# Patient Record
Sex: Female | Born: 1970 | Hispanic: Yes | Marital: Married | Smoking: Never smoker
Health system: Southern US, Community
[De-identification: ages and names within clinical notes are randomized; demographics above are authoritative.]

## PROBLEM LIST (undated history)

## (undated) DIAGNOSIS — R002 Palpitations: Secondary | ICD-10-CM

## (undated) DIAGNOSIS — C801 Malignant (primary) neoplasm, unspecified: Secondary | ICD-10-CM

## (undated) HISTORY — DX: Malignant (primary) neoplasm, unspecified: C80.1

## (undated) HISTORY — DX: Palpitations: R00.2

## (undated) HISTORY — PX: TUBAL LIGATION: SHX77

## (undated) HISTORY — PX: BASAL CELL CARCINOMA EXCISION: SHX1214

---

## 2013-11-23 ENCOUNTER — Encounter (HOSPITAL_COMMUNITY): Payer: Self-pay | Admitting: Emergency Medicine

## 2013-11-23 DIAGNOSIS — R1012 Left upper quadrant pain: Secondary | ICD-10-CM | POA: Insufficient documentation

## 2013-11-23 DIAGNOSIS — Z79899 Other long term (current) drug therapy: Secondary | ICD-10-CM | POA: Insufficient documentation

## 2013-11-23 DIAGNOSIS — Z3202 Encounter for pregnancy test, result negative: Secondary | ICD-10-CM | POA: Insufficient documentation

## 2013-11-23 LAB — CBC WITH DIFFERENTIAL/PLATELET
BASOS ABS: 0 10*3/uL (ref 0.0–0.1)
Basophils Relative: 0 % (ref 0–1)
Eosinophils Absolute: 0.2 10*3/uL (ref 0.0–0.7)
Eosinophils Relative: 2 % (ref 0–5)
HEMATOCRIT: 33.4 % — AB (ref 36.0–46.0)
HEMOGLOBIN: 11.4 g/dL — AB (ref 12.0–15.0)
LYMPHS ABS: 3.5 10*3/uL (ref 0.7–4.0)
Lymphocytes Relative: 37 % (ref 12–46)
MCH: 27.2 pg (ref 26.0–34.0)
MCHC: 34.1 g/dL (ref 30.0–36.0)
MCV: 79.7 fL (ref 78.0–100.0)
Monocytes Absolute: 0.8 10*3/uL (ref 0.1–1.0)
Monocytes Relative: 9 % (ref 3–12)
NEUTROS ABS: 5 10*3/uL (ref 1.7–7.7)
Neutrophils Relative %: 52 % (ref 43–77)
Platelets: 375 10*3/uL (ref 150–400)
RBC: 4.19 MIL/uL (ref 3.87–5.11)
RDW: 13.7 % (ref 11.5–15.5)
WBC: 9.4 10*3/uL (ref 4.0–10.5)

## 2013-11-23 NOTE — ED Notes (Signed)
Pt presents to department for evaluation of epigastric pain. Ongoing x3 days. Denies N/V/D. 5/10 pain at the time. Pt is alert and oriented x4. Currently taking Zantac at home with no relief. No signs of distress noted at present.

## 2013-11-24 ENCOUNTER — Emergency Department (HOSPITAL_COMMUNITY)
Admission: EM | Admit: 2013-11-24 | Discharge: 2013-11-24 | Disposition: A | Discharge status: Home / Self Care | Payer: Self-pay | Attending: Emergency Medicine | Admitting: Emergency Medicine

## 2013-11-24 DIAGNOSIS — R109 Unspecified abdominal pain: Secondary | ICD-10-CM

## 2013-11-24 LAB — COMPREHENSIVE METABOLIC PANEL
ALT: 14 U/L (ref 0–35)
AST: 12 U/L (ref 0–37)
Albumin: 3.6 g/dL (ref 3.5–5.2)
Alkaline Phosphatase: 72 U/L (ref 39–117)
BUN: 10 mg/dL (ref 6–23)
CO2: 22 meq/L (ref 19–32)
CREATININE: 0.51 mg/dL (ref 0.50–1.10)
Calcium: 8.8 mg/dL (ref 8.4–10.5)
Chloride: 102 mEq/L (ref 96–112)
GFR calc Af Amer: >90 mL/min (ref >90)
Glucose, Bld: 102 mg/dL — ABNORMAL HIGH (ref 70–99)
Potassium: 3.8 mEq/L (ref 3.7–5.3)
Sodium: 139 mEq/L (ref 137–147)
Total Bilirubin: <0.2 mg/dL — ABNORMAL LOW (ref 0.3–1.2)
Total Protein: 7.7 g/dL (ref 6.0–8.3)

## 2013-11-24 LAB — URINALYSIS, ROUTINE W REFLEX MICROSCOPIC
Bilirubin Urine: NEGATIVE
GLUCOSE, UA: NEGATIVE mg/dL
HGB URINE DIPSTICK: NEGATIVE
KETONES UR: NEGATIVE mg/dL
Leukocytes, UA: NEGATIVE
Nitrite: NEGATIVE
PROTEIN: NEGATIVE mg/dL
Specific Gravity, Urine: 1.02 (ref 1.005–1.030)
Urobilinogen, UA: 0.2 mg/dL (ref 0.0–1.0)
pH: 5.5 (ref 5.0–8.0)

## 2013-11-24 LAB — POC URINE PREG, ED: Preg Test, Ur: NEGATIVE

## 2013-11-24 LAB — LIPASE, BLOOD: Lipase: 27 U/L (ref 11–59)

## 2013-11-24 MED ORDER — NAPROXEN 250 MG PO TABS
500.0000 mg | ORAL_TABLET | Freq: Once | ORAL | Status: AC
  Administered 2013-11-24: 500 mg via ORAL
  Filled 2013-11-24: qty 2

## 2013-11-24 MED ORDER — CYCLOBENZAPRINE HCL 10 MG PO TABS: 10.0000 mg | ORAL_TABLET | Freq: Two times a day (BID) | ORAL | Status: DC | PRN

## 2013-11-24 MED ORDER — NAPROXEN 500 MG PO TABS: 500.0000 mg | ORAL_TABLET | Freq: Two times a day (BID) | ORAL | Status: DC

## 2013-11-24 NOTE — ED Notes (Signed)
Patient presents with c/o pain to the left rib area from lifting at work

## 2013-11-24 NOTE — Discharge Instructions (Signed)
Naprosyn twice daily.  . Emergency Department Resource Guide 1) Find a Doctor and Pay Out of Pocket Although you won't have to find out who is covered by your insurance plan, it is a good idea to ask around and get recommendations. You will then need to call the office and see if the doctor you have chosen will accept you as a new patient and what types of options they offer for patients who are self-pay. Some doctors offer discounts or will set up payment plans for their patients who do not have insurance, but you will need to ask so you aren't surprised when you get to your appointment.  2) Contact Your Local Health Department Not all health departments have doctors that can see patients for sick visits, but many do, so it is worth a call to see if yours does. If you don't know where your local health department is, you can check in your phone book. The CDC also has a tool to help you locate your state's health department, and many state websites also have listings of all of their local health departments.  3) Find a [facility] If your illness is not likely to be very severe or complicated, you may want to try a walk in clinic. These are popping up all over the country in pharmacies, drugstores, and shopping centers. They're usually staffed by nurse practitioners or physician assistants that have been trained to treat common illnesses and complaints. They're usually fairly quick and inexpensive. However, if you have serious medical issues or chronic medical problems, these are probably not your best option.  No Primary Care Doctor: - Call Health Connect at  [phone] - they can help you locate a primary care doctor that  accepts your insurance, provides certain services, etc. - Physician Referral Service- [phone]  Chronic Pain Problems: Organization         Address  Phone   Notes  [facility]  [phone] Patients need to be referred by their primary care doctor.    Medication Assistance: Organization         Address  Phone   Notes  [facility] [address] [place], [place] [address] [phone] --Must be a resident of [facility] -- Must have NO insurance coverage whatsoever (no Medicaid/ Medicare, etc.) -- The pt. MUST have a primary care doctor that directs their care regularly and follows them in the community   [facility]ist  [phone]   [employer]  [phone]    Agencies that provide inexpensive medical care: Organization         Address  Phone   Notes  [place]  [phone]   [facility]    [phone]   [facility] [place], [place] [address] [phone]   [place] [address], [place] [phone]   Planned Parenthood    [phone]   [facility]    [phone]   [place] and [address], [place] Phone:  [phone], Fax:  [phone] Hours of Operation:  9 am - 6 pm, M-F.  Also accepts Medicaid/Medicare and self-pay.  [facility] for [place] [address], [address], [place] Phone: [phone], Fax: [phone]. Hours of Operation:  8:30 am - 5:30 pm, M-F.  Also accepts Medicaid and self-pay.  [facility] [address], [place] Phone: [phone]   [place], [place], [place] [phone], Ext. [phone] Mondays & Thursdays: 7-9 AM.  First 15 patients are seen on a first come, first serve basis.    [place] Providers:  Organization         Address  Phone   Notes  [facility] [address], [place] [phone] Also accepts self-pay patients.  [facility] [address], [place]  [phone]   [place], [address], [place] [phone]   [facility] [address], [place] [phone]   Lucianne Lei [address], [place]   [phone] Only accepts [place] Access [place] patients after they have their name applied to their card.   Self-Pay (no insurance) in [facility]:  Organization         Address  Phone   Notes  Sickle Cell Patients, [facility] [place] [phone]   [facility] [place] [phone]   [facility] [place]  [place], [place], [place] [phone]   [facility]/Dr. Osei-Bonsu  [address] [place] or [address], [place] [phone] Phone number for both [place] and [place] locations is the same.  Urgent Medical and [facility] [address] [place] [phone]   [facility] [address] [place] or [address] [phone]   [facility] [address] [place] [phone], phone; [phone], fax Sees patients 1st and 3rd Saturday of every month.  Must not qualify for public or private insurance (i.e. Medicaid, Medicare, [place] Health Choice, Veterans' Benefits)  Household income should be no more than 200% of the poverty level The clinic cannot treat you if you are pregnant or think you are pregnant  Sexually transmitted diseases are not treated at the clinic.    Dental Care: Organization         Address  Phone  Notes  [facility] Department of [facility] [phone] Accepts children up to age 14 who are enrolled in [place] or [place]; pregnant women with a Medicaid card; and children who have applied for Medicaid or [place] Health Choice, but were declined, whose parents can pay a reduced fee at time of service.    [facility] Department of [facility]  [address], [place] [phone] Accepts children up to age 38 who are enrolled in [place] or [place]; pregnant women with a Medicaid card; and children who have applied for Medicaid or [place] Health Choice, but were declined, whose parents can pay a reduced fee at time of service.  [facility] Adult Dental Access PROGRAM  [place] [phone] Patients are seen by appointment only. Walk-ins are not accepted. [place] will see patients 47 years of age and older. Monday - Tuesday (8am-5pm) Most Wednesdays (8:30-5pm) $30 per visit, cash only  [facility] Adult Dental Access PROGRAM  [address], [facility] [phone] Patients are seen by appointment only. Walk-ins are not accepted. [place] will see patients 18 years  of age and older. One Wednesday Evening (Monthly: Volunteer Based).  $30 per visit, cash only  [place]  [phone] for adults; Children under age 87, call Graduate Pediatric Dentistry at [phone]. Children aged 20-14, please call [phone] to request a pediatric application.  Dental services are provided in all areas of dental care including fillings, crowns and bridges, complete and partial dentures, implants, gum treatment, root canals, and extractions. Preventive care is also provided. Treatment is provided to both adults and children. Patients are selected via a lottery and there is often a waiting list.   [facility] [address], [place]  [phone] [email or web address]   [facility] [address] [place], [place] [phone], Ext. [phone] Second and Fourth Thursday of each month, opens at 6:30 AM; Clinic ends at 9 AM.  Patients are seen on a first-come first-served basis, and a limited number are seen during each clinic.   [facility]  [address] [place], [place] [phone]   Eligibility Requirements You must have lived in [place], [place], or [place] counties for at least the last three months.   You cannot be eligible for state or federal sponsored [employer], including [employer], [place], or [employer].   You generally cannot be eligible for healthcare insurance through your employer.    How to apply: Eligibility screenings are held every Tuesday and Wednesday afternoon from 1:00 pm until 4:00 pm. You do not need an appointment for the interview!  [facility] [address] [place], [place]   [place]  Wells Department  [place]  [phone]    Behavioral Health Resources in the Community: Intensive Outpatient Programs Organization         Address  Phone  Notes  [facility] [place]. [address] [place], [place] [phone]   [facility] [address] [place], [place]   ADS: Alcohol & Drug S[facility] [address], [place], [place]   [place]up[place] [address]  [place], [place] or [phone]   Substance Abuse Resources Organization         Address  Phone  Notes  Alcohol and Drug Services  [phone]   [place]  [phone]   The [place]   Chinita Pester  [phone]   Residential & Outpatient Substance Abuse Program  [phone]   Psychological Services Organization         Address  Phone  Notes  [facility]  [place]  [phone]   [place] [address], [place] or [phone]    Mobile Crisis Teams Organization         Address  Phone  Notes  [facility], Mobile Crisis Care Unit  [phone]   [facility]  [address] [place], [place]   [place] [address], [place] [phone]    Self-Help/Support Groups Organization         Address  Phone             Notes  [place]. of [place] - variety of support groups  [place] Call for more information  Narcotics Anonymous (NA), [facility] [address] [place]  2 meetings at this location   Residential Treatment Programs Organization         Address  Phone  Notes  [facility] [address]    [place]  [phone]   [facility]  [address] [place] [phone], [place], [place]   [place], [place] [phone] Admissions: 8am-3pm M-F  Incentives Substance [place] [address]    [place], [place] [phone]   [facility] [address] [place], [place], [place]   [facility] [address]  [place], [place]   [facility] - Intensive Outpatient [address], [place], [place]   [facility] ([place].,  [place], [place] [phone] or [phone]   [facility] Treatment Services (RTS) [address] [place], [place] Accepts Medicaid  [facility] [place] [address]  [place] [phone] Substance Abuse/Addiction Treatment   [facility] Organization         Address  Phone  Notes  [facility]  [phone]   Domenic Schwab, PhD [address] [place], [place]   [phone] or [phone]   [place] [facility] [place], [place] [phone]   [facility] [address], [place], [place] [phone] Insurance/Medicaid/sponsorship through [facility] and Families [address]                                    [place], [place] [phone]  [place] [address], [place] [phone]    Dr. Adele Schilder  [phone]   [facility] Dept. 1) [address], [place] 2) [place] 3)  [place] [address], [place] [phone]  [phone]   [place] [phone] or [phone] (After Hours)

## 2013-11-24 NOTE — ED Provider Notes (Signed)
CSN: [id number]     Arrival date & time 11/23/13  2252 History   First MD Initiated Contact with Patient 11/24/13 0202     Chief Complaint  Patient presents with  . Abdominal Pain     (Consider location/radiation/quality/duration/timing/severity/associated sxs/prior Treatment) HPI Comments: 43 year old female who presents with left upper quadrant abdominal pain which has been present for 3 days, constant, worse with trying to bend over and pick up heavy things, not associated with nausea vomiting diarrhea fevers chills dysuria diarrhea constipation or blood in the stools. She has no chest pain, no cough, no shortness of breath no fever. The symptoms are mild at rest. She does not relate the onset of these symptoms with any traumatic injury. She has no history of prior abdominal surgery.  Patient is a 43 y.o. female presenting with abdominal pain. The history is provided by the patient and a relative.  Abdominal Pain   History reviewed. No pertinent past medical history. History reviewed. No pertinent past surgical history. No family history on file. History  Substance Use Topics  . Smoking status: Never Smoker   . Smokeless tobacco: Not on file  . Alcohol Use: No   OB History   Grav Para Term Preterm Abortions TAB SAB Ect Mult Living                 Review of Systems  Gastrointestinal: Positive for abdominal pain.  All other systems reviewed and are negative.      Allergies  Review of patient's allergies indicates no known allergies.  Home Medications   Current Outpatient Rx  Name  Route  Sig  Dispense  Refill  . fenofibrate 54 MG tablet   Oral   Take 54 mg by mouth daily.         . ranitidine (ZANTAC) 150 MG tablet   Oral   Take 150 mg by mouth 2 (two) times daily.         . cyclobenzaprine (FLEXERIL) 10 MG tablet   Oral   Take 1 tablet (10 mg total) by mouth 2 (two) times daily as needed for muscle spasms.   20 tablet   0   . naproxen (NAPROSYN) 500 MG  tablet   Oral   Take 1 tablet (500 mg total) by mouth 2 (two) times daily with a meal.   30 tablet   0    BP 144/74  Pulse 84  SpO2 100% Physical Exam  Nursing note and vitals reviewed. Constitutional: She appears well-developed and well-nourished. No distress.  HENT:  Head: Normocephalic and atraumatic.  Mouth/Throat: Oropharynx is clear and moist. No oropharyngeal exudate.  Eyes: Conjunctivae and EOM are normal. Pupils are equal, round, and reactive to light. Right eye exhibits no discharge. Left eye exhibits no discharge. No scleral icterus.  Neck: Normal range of motion. Neck supple. No JVD present. No thyromegaly present.  Cardiovascular: Normal rate, regular rhythm, normal heart sounds and intact distal pulses.  Exam reveals no gallop and no friction rub.   No murmur heard. Pulmonary/Chest: Effort normal and breath sounds normal. No respiratory distress. She has no wheezes. She has no rales.  Abdominal: Soft. Bowel sounds are normal. She exhibits no distension and no mass. There is tenderness ( Focal left upper quadrant tenderness which is mild, reproducible. Positive Carnett's sign).  Musculoskeletal: Normal range of motion. She exhibits no edema and no tenderness.  Lymphadenopathy:    She has no cervical adenopathy.  Neurological: She is alert. Coordination normal.  Skin: Skin is warm and dry. No rash noted. No erythema.  Psychiatric: She has a normal mood and affect. Her behavior is normal.    ED Course  Procedures (including critical care time) Labs Review Labs Reviewed  CBC WITH DIFFERENTIAL - Abnormal; Notable for the following:    Hemoglobin 11.4 (*)    HCT 33.4 (*)    All other components within normal limits  COMPREHENSIVE METABOLIC PANEL - Abnormal; Notable for the following:    Glucose, Bld 102 (*)    Total Bilirubin <0.2 (*)    All other components within normal limits  LIPASE, BLOOD  URINALYSIS, ROUTINE W REFLEX MICROSCOPIC  POC URINE PREG, ED    Imaging Review No results found.   EKG Interpretation None      MDM   Final diagnoses:  Abdominal pain    No CVA tenderness, labs are unremarkable, urinalysis and pregnancy pending though I suspect a muscle wall strain. No other specific symptoms or abdominal symptoms that would be suggestive of a intra-abdominal or surgical process. Pain medicine is ordered.  No imaging necessary, patient has normal urinalysis, not pregnant, [place] as below   Meds given in ED:  Medications  naproxen (NAPROSYN) tablet 500 mg (500 mg Oral Given 11/24/13 0306)    New Prescriptions   CYCLOBENZAPRINE (FLEXERIL) 10 MG TABLET    Take 1 tablet (10 mg total) by mouth 2 (two) times daily as needed for muscle spasms.   NAPROXEN (NAPROSYN) 500 MG TABLET    Take 1 tablet (500 mg total) by mouth 2 (two) times daily with a meal.      Johnna Acosta, MD 11/24/13 [phone]

## 2015-02-13 ENCOUNTER — Encounter (HOSPITAL_COMMUNITY): Payer: Self-pay | Admitting: *Deleted

## 2015-02-13 ENCOUNTER — Emergency Department (HOSPITAL_COMMUNITY): Payer: Self-pay

## 2015-02-13 ENCOUNTER — Emergency Department (HOSPITAL_COMMUNITY)
Admission: EM | Admit: 2015-02-13 | Discharge: 2015-02-13 | Disposition: A | Discharge status: Home / Self Care | Payer: Self-pay | Attending: Emergency Medicine | Admitting: Emergency Medicine

## 2015-02-13 DIAGNOSIS — Z791 Long term (current) use of non-steroidal anti-inflammatories (NSAID): Secondary | ICD-10-CM | POA: Insufficient documentation

## 2015-02-13 DIAGNOSIS — R51 Headache: Secondary | ICD-10-CM | POA: Insufficient documentation

## 2015-02-13 DIAGNOSIS — R110 Nausea: Secondary | ICD-10-CM | POA: Insufficient documentation

## 2015-02-13 DIAGNOSIS — R002 Palpitations: Secondary | ICD-10-CM | POA: Insufficient documentation

## 2015-02-13 DIAGNOSIS — R457 State of emotional shock and stress, unspecified: Secondary | ICD-10-CM | POA: Insufficient documentation

## 2015-02-13 DIAGNOSIS — R531 Weakness: Secondary | ICD-10-CM | POA: Insufficient documentation

## 2015-02-13 DIAGNOSIS — R42 Dizziness and giddiness: Secondary | ICD-10-CM | POA: Insufficient documentation

## 2015-02-13 LAB — COMPREHENSIVE METABOLIC PANEL
ALBUMIN: 3.9 g/dL (ref 3.5–5.0)
ALK PHOS: 78 U/L (ref 38–126)
ALT: 26 U/L (ref 14–54)
ANION GAP: 7 (ref 5–15)
AST: 20 U/L (ref 15–41)
BILIRUBIN TOTAL: 0.3 mg/dL (ref 0.3–1.2)
BUN: 12 mg/dL (ref 6–20)
CALCIUM: 9 mg/dL (ref 8.9–10.3)
CO2: 28 mmol/L (ref 22–32)
Chloride: 101 mmol/L (ref 101–111)
Creatinine, Ser: 0.55 mg/dL (ref 0.44–1.00)
GFR calc Af Amer: >60 mL/min (ref >60)
Glucose, Bld: 155 mg/dL — ABNORMAL HIGH (ref 65–99)
Potassium: 3.7 mmol/L (ref 3.5–5.1)
SODIUM: 136 mmol/L (ref 135–145)
Total Protein: 7.5 g/dL (ref 6.5–8.1)

## 2015-02-13 LAB — CBC WITH DIFFERENTIAL/PLATELET
Basophils Absolute: 0 10*3/uL (ref 0.0–0.1)
Basophils Relative: 0 % (ref 0–1)
EOS ABS: 0.1 10*3/uL (ref 0.0–0.7)
Eosinophils Relative: 1 % (ref 0–5)
HCT: 37.6 % (ref 36.0–46.0)
HEMOGLOBIN: 12.2 g/dL (ref 12.0–15.0)
LYMPHS ABS: 2.5 10*3/uL (ref 0.7–4.0)
Lymphocytes Relative: 25 % (ref 12–46)
MCH: 26.5 pg (ref 26.0–34.0)
MCHC: 32.4 g/dL (ref 30.0–36.0)
MCV: 81.7 fL (ref 78.0–100.0)
MONOS PCT: 5 % (ref 3–12)
Monocytes Absolute: 0.4 10*3/uL (ref 0.1–1.0)
Neutro Abs: 6.8 10*3/uL (ref 1.7–7.7)
Neutrophils Relative %: 69 % (ref 43–77)
Platelets: 462 10*3/uL — ABNORMAL HIGH (ref 150–400)
RBC: 4.6 MIL/uL (ref 3.87–5.11)
RDW: 13.4 % (ref 11.5–15.5)
WBC: 9.8 10*3/uL (ref 4.0–10.5)

## 2015-02-13 MED ORDER — LORAZEPAM 0.5 MG PO TABS
0.5000 mg | ORAL_TABLET | Freq: Once | ORAL | Status: AC
  Administered 2015-02-13: 0.5 mg via ORAL
  Filled 2015-02-13: qty 1

## 2015-02-13 MED ORDER — SODIUM CHLORIDE 0.9 % IV BOLUS (SEPSIS)
1000.0000 mL | Freq: Once | INTRAVENOUS | Status: AC
  Administered 2015-02-13: 1000 mL via INTRAVENOUS

## 2015-02-13 NOTE — ED Notes (Signed)
Pt walked to and from restroom 3 O2 was 97 pt state did not feel weakness

## 2015-02-13 NOTE — ED Provider Notes (Signed)
CSN: [id number]     Arrival date & time 02/13/15  1533 History   First MD Initiated Contact with Patient 02/13/15 1814     Chief Complaint  Patient presents with  . Nausea  . Headache  . Weakness     (Consider location/radiation/quality/duration/timing/severity/associated sxs/prior Treatment) HPI   PCP: No PCP Per Patient Blood pressure 144/79, pulse 78, temperature 97.9 F (36.6 C), temperature source Oral, resp. rate 21, last menstrual period 02/01/2015, SpO2 98 %.  Miranda Navarro is a 45 y.o.female without any significant PMH presents to the ER with complaints of altered symptoms. She reports that at bedtime she notices heart palpitations and has the urge to cry. She lately has been feeling weak with some intermittent dizziness. She is emotional and tearful during the interview when she talks about tomorrow being the anniversary of her grandmother dying. She is very upset about it. When she lays down at bed she thinks about her family and it upsets her and causes a headache. She is also taking weight loss pills that she recently started and has not been eating or drinking as much lately. She is brought here by her family for evaluation.  The patient denies diaphoresis, fever, headache, weakness (general or focal), confusion, change of vision,  neck pain, dysphagia, aphagia, chest pain, shortness of breath,  back pain, abdominal pains, nausea, vomiting, diarrhea, lower extremity swelling, rash.    History reviewed. No pertinent past medical history. History reviewed. No pertinent past surgical history. No family history on file. History  Substance Use Topics  . Smoking status: Never Smoker   . Smokeless tobacco: Not on file  . Alcohol Use: No   OB History    No data available     Review of Systems  10 Systems reviewed and are negative for acute change except as noted in the HPI.    Allergies  Review of patient's allergies indicates no known allergies.  Home  Medications   Prior to Admission medications   Medication Sig Start Date End Date Taking? Authorizing Provider  cyclobenzaprine (FLEXERIL) 10 MG tablet Take 1 tablet (10 mg total) by mouth 2 (two) times daily as needed for muscle spasms. Patient not taking: Reported on 02/13/2015 11/24/13   Noemi Chapel, MD  naproxen (NAPROSYN) 500 MG tablet Take 1 tablet (500 mg total) by mouth 2 (two) times daily with a meal. Patient not taking: Reported on 02/13/2015 11/24/13   Noemi Chapel, MD   BP 120/65 mmHg  Pulse 68  Temp(Src) 97.5 F (36.4 C) (Oral)  Resp 22  SpO2 99%  LMP 02/01/2015 Physical Exam  Constitutional: She appears well-developed and well-nourished. No distress.  HENT:  Head: Normocephalic and atraumatic.  Eyes: Pupils are equal, round, and reactive to light.  Neck: Normal range of motion. Neck supple.  Cardiovascular: Normal rate and regular rhythm.   Pulmonary/Chest: Effort normal and breath sounds normal. She has no decreased breath sounds. She has no wheezes.  Abdominal: Soft.  Neurological: She is alert.  Cranial nerves II-VIII and X-XII evaluated and show no deficits. Pt alert and oriented x 3 Upper and lower extremity strength is symmetrical and physiologic Normal muscular tone No facial droop Coordination intact, no limb ataxia,  No pronator drift   Skin: Skin is warm and dry.  Nursing note and vitals reviewed.   ED Course  Procedures (including critical care time) Labs Review Labs Reviewed  CBC WITH DIFFERENTIAL/PLATELET - Abnormal; Notable for the following:    Platelets 462 (*)  All other components within normal limits  COMPREHENSIVE METABOLIC PANEL - Abnormal; Notable for the following:    Glucose, Bld 155 (*)    All other components within normal limits    Imaging Review Dg Chest 2 View  02/13/2015   CLINICAL DATA:  Patient with nausea, dizziness and chest palpitations for 15 days.  EXAM: CHEST  2 VIEW  COMPARISON:  None.  FINDINGS: Normal cardiac and  mediastinal contours. No consolidative pulmonary opacities. No pleural effusion or pneumothorax. Regional skeleton is unremarkable. Projecting over the mediastinum on frontal view is a nonspecific radiodensity, potentially external to the patient.  IMPRESSION: No acute cardiopulmonary process.  Radiodensity projecting over the mediastinum on the frontal view, potentially external to the patient.   Electronically Signed   By: Lovey Newcomer M.D.   On: 02/13/2015 19:22     EKG Interpretation   Date/Time:  Monday February 13 2015 19:34:07 EDT Ventricular Rate:  78 PR Interval:  180 QRS Duration: 86 QT Interval:  391 QTC Calculation: 445 R Axis:   37 Text Interpretation:  Sinus rhythm Borderline T abnormalities, diffuse  leads No previous tracing Confirmed by POLLINA  MD, CHRISTOPHER [phone]) on  02/13/2015 8:06:54 PM      MDM   Final diagnoses:  Palpitation    Patient has had no chest pain or shortness of breath. She has been taking Diet pills, name of them is not known and her symptoms have worsened since then. She also is emotionally upset about the anniversary of her grandmothers death approaching. She has family present who is sympathetic and comforts patient. The patient cries every time I ask about her stress. I will give resources for support groups. She also has been advised to discontinue taking her diet pills, they can be dangerous and may be causing her symptoms as well. She needs to eat a well balanced diet in order to improve her headaches and weakness as well. Pt feeling very well at baseline with no weakness, headache, palpitations or any other associated symptoms.  CBC and CMP are unremarkable, non acute EKG and chest xray reassuring. Referral to Cardiology for halter monitor if she continues to have palpitations.  Medications  sodium chloride 0.9 % bolus 1,000 mL (0 mLs Intravenous Stopped 02/13/15 2040)  LORazepam (ATIVAN) tablet 0.5 mg (0.5 mg Oral Given 02/13/15 2001)    44  y.o.Miranda Navarro's evaluation in the Emergency Department is complete. It has been determined that no acute conditions requiring further emergency intervention are present at this time. The patient/guardian have been advised of the diagnosis and plan. We have discussed signs and symptoms that warrant return to the ED, such as changes or worsening in symptoms.  Vital signs are stable at discharge. Filed Vitals:   02/13/15 2051  BP: 120/65  Pulse: 68  Temp: 97.5 F (36.4 C)  Resp: 22    Patient/guardian has voiced understanding and agreed to follow-up with the PCP or specialist.    Delos Haring, PA-C 02/13/15 2100  Daleen Bo, MD 02/13/15 2322

## 2015-02-13 NOTE — ED Notes (Signed)
Patient transported to CT 

## 2015-02-13 NOTE — ED Notes (Signed)
Patient transported to X-ray 

## 2015-02-13 NOTE — ED Notes (Signed)
Pt complains of nausea, dizziness, chest palpitations for the past 15 days. Pt also complains of headache for the past 4 days. Pt denies vomiting/diarrhea/abdominal pain. Pt speaks spanish.

## 2015-02-13 NOTE — Discharge Instructions (Signed)
° °Estrés y control del estrés °(Stress and Stress Management) °El estrés es una reacción normal a los sucesos de la vida. Es lo que se siente cuando la vida le exige más de lo que está acostumbrado o más de lo que puede manejar. Un poco de estrés puede ser útil. Por ejemplo, la reacción al estrés puede ayudarlo a tomar el último autobús del día, a estudiar para un examen o a cumplir con un plazo límite en el trabajo. Sin embargo, el estrés muy frecuente o que dura mucho tiempo puede causar problemas. Puede afectar la salud emocional y dificultar las relaciones y las actividades cotidianas normales. El exceso de estrés puede debilitar el sistema inmunitario y aumentar el riesgo de que tenga enfermedades físicas. Si ya tiene un problema médico, el estrés puede empeorarlo. °CAUSAS  °Todos los sucesos de la vida pueden causar estrés. Un suceso que le causa estrés a una persona puede no ser estresante para otra. Generalmente, los sucesos importantes de la vida causan estrés. Estos pueden ser positivos o negativos, por ejemplo, quedarse sin empleo, mudarse a una casa nueva, casarse, tener un bebé o perder a un ser querido. Los sucesos de la vida menos evidentes también pueden causar estrés, especialmente si ocurren día tras día o en combinación. Por ejemplo, trabajar muchas horas, conducir cuando hay mucho tránsito, cuidar a los niños, tener deudas o tener una relación difícil. °SIGNOS Y SÍNTOMAS °El estrés puede causar síntomas emocionales que incluyen lo siguiente: °· Ansiedad. Sentirse preocupado, temeroso, nervioso, abrumado o fuera de control. °· Enojo. Sentirse irritado o impaciente. °· Depresión. Sentirse triste, decaído, desesperanzado o culpable. °· Dificultad para concentrarse, recordar o tomar decisiones. °El estrés puede causar síntomas físicos que incluyen lo siguiente:  °· Dolores y molestias. Estos pueden afectar la cabeza, el cuello, la espalda, el estómago u otras zonas del cuerpo. °· Rigidez muscular o  tensión mandibular. °· Poca energía o dificultad para dormir. °El estrés puede provocar conductas poco saludables, que incluyen lo siguiente:  °· Comer para sentirse mejor (comer en exceso) o saltear comidas. °· Dormir muy poco, mucho o ambas cosas. °· Trabajar mucho o postergar tareas (posponer). °· Fumar, beber alcohol o consumir drogas para sentirse mejor. °DIAGNÓSTICO  °El estrés se diagnostica a través de una evaluación que realiza el médico. El médico le hará preguntas sobre los síntomas o cualquier suceso estresante de la vida. Además, el médico le hará preguntas sobre sus antecedentes médicos y tal vez indique análisis de sangre u otros estudios. Ciertas afecciones y algunos medicamentos pueden provocar síntomas físicos similares a los del estrés. Las enfermedades mentales pueden causar síntomas emocionales y provocar conductas poco sanas similares a los del estrés. El médico podrá derivarlo a un profesional de la salud mental para que le realice una evaluación más profunda.  °TRATAMIENTO  °El tratamiento recomendado para el estrés es el control del estrés. Los objetivos del control del estrés son reducir los eventos estresantes de la vida y enfrentar el estrés de maneras saludables.  °Las técnicas para reducir los eventos estresantes de la vida incluyen lo siguiente: °· Identificación del estrés. Autocontrolar el estrés e identificar qué lo causa. Estas habilidades pueden ayudarlo a evitar algunos sucesos estresantes. °· Control del tiempo. Establecer las prioridades, llevar un calendario de los sucesos y aprender a decir "no". Estas herramientas pueden ayudarlo a evitar que asuma muchos compromisos. °Las técnicas para lidiar con el estrés incluyen lo siguiente: °· Replantearse el problema. Tratar de pensar de un modo realista los eventos   estresantes en lugar de ignorarlos o tener reacciones exageradas. Intente encontrar los aspectos positivos en una situación estresante, en lugar de enfocarse en los  negativos. °· La actividad física. El ejercicio físico puede liberar la tensión física y emocional. La clave es encontrar un tipo de ejercicio físico que disfrute y practique con regularidad. °· Técnicas de relajación. Estas relajan el cuerpo y la mente. Los ejemplos son el yoga, la meditación, el tai chi, la biorregulación, la respiración profunda, la relajación muscular progresiva, escuchar música, estar al aire libre en contacto con la naturaleza, llevar un diario y otros pasatiempos. Nuevamente, la clave es encontrar una o más opciones que disfrute y pueda practicar con regularidad. °· Estilo de vida saludable. Coma una dieta equilibrada, duerma mucho y no fume. Evite el consumo de alcohol o de drogas para relajarse. °· Red de apoyo sólida. Pase tiempo con la familia, los amigos y otras personas cuya compañía disfrute. Exprese sus sentimientos y converse acerca de las cosas que le preocupan con alguien en quien confíe. °La orientación o la psicoterapiacon un profesional de la salud mental pueden ser de ayuda si tiene dificultades para controlar el estrés por sí solo. Generalmente, no se recomiendan medicamentos para el tratamiento del estrés. Hable con el médico si considera que necesita medicamentos para los síntomas de estrés. °INSTRUCCIONES PARA EL CUIDADO EN EL HOGAR °· Concurra a todas las visitas de control como se lo haya indicado el médico. °· Tome todos los medicamentos como se lo haya indicado el médico. °SOLICITE ATENCIÓN MÉDICA SI: °· Los síntomas empeoran o empieza a tener síntomas nuevos. °· Se siente abrumado por los problemas y ya no puede manejarlos solo. °SOLICITE ATENCIÓN MÉDICA DE INMEDIATO SI: °· Siente deseos de lastimarse o lastimar a otra persona. °Document Released: 05/22/2005 Document Revised: 12/27/2013 °ExitCare® Patient Information ©2015 Exit[employer] LLC. This information is not intended to replace advice given to you by your health care provider. Make sure you discuss any questions you  have with your health care provider. ° °Palpitaciones  °(Palpitations) ° Las palpitaciones producen la sensación de que los latidos cardíacos son irregulares. Se siente como un aleteo o que falta un latido. También puede sentir que el corazón late más rápido que lo normal. Generalmente no es un problema grave. En algunos casos podría necesitar hacer más pruebas diagnósticas.  °CUIDADOS EN EL HOGAR °· Evite: °· La cafeína que contienen el café, el té, las gaseosas, los diuréticos y las bebidas energizantes. °· El chocolate. °· El alcohol. °· Si fuma, abandone el hábito. °· Reduzca los niveles de estrés y ansiedad. Intente: °· Aprender algún método que controle las funciones del organismo (bioretroalimentación). °· Yoga. °· Meditación. °· Actividad física como natación, trote o caminatas. °· Descanse y duerma lo suficiente. °SOLICITE AYUDA SI: °· Los latidos rápidos o irregulares continúan durante 24 horas. °· Las palpitaciones le suceden con más frecuencia. °SOLICITE AYUDA DE INMEDIATO SI:  °· Siente dolor en el pecho. °· Le falta el aire. °· Siente un dolor de cabeza muy fuerte. °· Tiene mareos o se desmaya. °ASEGÚRESE DE QUE:  °· Comprende estas instrucciones. °· Controlará su enfermedad. °· Solicitará ayuda de inmediato si usted o el niño no mejora o si empeora. °Document Released: 09/14/2010 Document Revised: 12/27/2013 °ExitCare® Patient Information ©2015 ExitCare[employer] LLC. This information is not intended to replace advice given to you by your health care provider. Make sure you discuss any questions you have with your health care provider. ° ° ° ° °RESOURCE GUIDE ° °  Chronic Pain Problems: Contact [facility]  [phone] Patients need to be referred by their primary care doctor.  Insufficient Money for Medicine: Contact United Way:  call "211" or [place] [phone].  No Primary Care Doctor: Call [facility]  [phone] - can help you locate a primary care doctor that  accepts  your insurance, provides certain services, etc. Physician Referral Service- [phone]  Agencies that provide inexpensive medical care: [facility]  [facility]  [phone] [facility]  [phone] [facility]  [phone] [facility]  [phone] [facility]  [phone]  [place] Providers: [facility]- [address], [address]  [phone], Mon-Fri 9am-7pm, Sat 9am-1pm [place], [address], [address] [place]  [place]- [address]  [place], [address], [phone]  Only accepts [place] patients after they have their name  applied to their card  Self Pay (no insurance) in [facility]: Sickle Cell Patients: Dr Kevan Ny, [facility]  [place], [facility]- [place]  [facility]- [address], [facility]- see information above (Speak to [employer] if you do not have insurance)       -  [facility]- [place], [place],  [place], [place]  Dr Vista Lawman-  [address], [place], [facility]- [address], [phone]       -  [facility]- [address], [place], also [address], [phone]       -    [facility]- [address], [place], 1st & 3rd Saturday   every month, 10am-1pm  1) Find a Doctor and Pay Out of Pocket Although you won't have to find out who is covered by your insurance plan, it is a good idea to ask around and get  recommendations. You will then need to call the office and see if the doctor you have chosen will accept you as a new patient and what types of options they offer for patients who are self-pay. Some doctors offer discounts or will set up payment plans for their patients who do not have insurance, but you will need to ask so you aren't surprised when you get to your appointment.  2) Contact Your Local Health Department Not all health departments have doctors that can see patients for sick visits, but many do, so it is worth a call to see if yours does. If you don't know where your local health department is, you can check in your phone book. The CDC also has a tool to help you locate your state's health department, and many state websites also have listings of all of their local health departments.  3) Find a [facility] If your illness  is not likely to be very severe or complicated, you may want to try a walk in clinic. These are popping up all over the country in pharmacies, drugstores, and shopping centers. They're usually staffed by nurse practitioners or physician assistants that have been trained to treat common illnesses and complaints. They're usually fairly quick and inexpensive. However, if you have serious medical issues or chronic medical problems, these are probably not your best option ° °STD Testing °[place] [facility] [place], STD Clinic, [address], [place], phone [phone] or [phone].  Monday - Friday, call for an appointment. °[place] [facility] [place], STD Clinic, [address], [place], phone [phone] or [phone].  Monday - Friday, call for an appointment. ° °Abuse/Neglect: °[place] Child Abuse Hotline (336) 641-3795 °[place] Child Abuse Hotline 800-378-5315 (After Hours) ° °Emergency Shelter:  [facility] (336) 271-5985 ° °Maternity Homes: °[facility]  (336) 275-9566 °[facility] (704) 372-4663 ° °MRSA Hotline #:   832-7006 ° °[place] Resources ° °[facility] [place]  [employer] [place] [facility] °[address]                 [address]         [address]  °[place]                                               [place]                              [place] °Phone:  349-3220                                  Phone:  [phone]                   Phone:  342-8140 ° °[place] [facility], 342-8316 °[place] Services - [facility]- 1-888-581-9988 °      -     [facility] in [place], [address],                                  336-349-4454, Insurance ° °[place] Child Abuse Hotline °(336) 342-1394 or (336) 342-3537 (After Hours) ° ° °Behavioral Health Services ° °Substance Abuse Resources: °[facility]  336-882-2125 °[facility] 336-784-9470 °[facility] 336-285-9073 °[facility] 336-845-3988 °Residential & Outpatient Substance Abuse Program  800-659-3381 ° °Psychological Services: °[facility]  832-9600 °[facility]  378-7881 °[place], [address], [place], ACCESS LINE: 1-800-853-5163 or 336-641-4981, [email or web address].[email or web address] ° °Dental Assistance ° °If unable to pay or uninsured, contact:  [facility] or [place] [facility] to become qualified for the adult dental clinic. ° °Patients with Medicaid: [facility] °[address], [phone] °[address], [phone] ° °If unable to pay, or uninsured, contact [facility] (271-5999) or [place] [facility] (641-3152 in , 842-7733 in [place]) to become qualified for the adult dental clinic ° °Other Low-Cost Community Dental Services: °  [facility]- [address] [place], [place], [address],  514-818-1726, [address]. [address], 2nd and 4th Thursday of the month at 6:30am.  10 clients each day by appointment, can sometimes see walk-in patients if someone does not show for an appointment. [facility]- [address] [place]er [place], [place], [address], [place], [place], [place], [address], [place] [facility] [phone]  Please make every effort to establish with a primary care physician for routine medical care  [place]  [facility] provides a wide range of adult health services. Some of these services are designed to address the healthcare needs of all [facility] residents and all services are designed to meet the needs of uninsured/underinsured low income residents. Some services are available to any resident of [place], call [phone] for details. ] The [facility], a new medical clinic for adults, is now open. For more information about the Center and its services please call [phone]. For information on our [place] services, click here.  For more information on any of the following Department of Public Health programs, including hours of service, click on the highlighted link.  SERVICES FOR WOMEN (Adults and Teens) [employer] provide a full range of birth control options plus education and counseling. New patient visit and annual return visits include a complete examination, pap test as indicated, and other laboratory as indicated. Included is our [employer] for men.  Maternity Care is provided through pregnancy, including a six week post partum exam. Women who meet eligibility criteria for the Medicaid for Pregnant Women program, receive care free. Other women are charged on a sliding scale according to  income. Note: [facility] provides services to pregnant women who have a Medicaid card. Call [phone] for an appointment in [place] or [phone] for an appointment in [facility].  Primary Care for Medicaid [place] Access Women is available through the [facility]. As primary care provider for the [place] program, women may designate the [facility] clinic as their primary care provider.  PLEASE CALL R5958090 FOR AN APPOINTMENT FOR THE ABOVE SERVICES IN EITHER [place] OR [place]. Information available in [place] and [place].   Childbirth Education Classes are open to the public and offered to help families prepare for the best possible childbirth experience as well as to promote lifelong health and wellness. Classes are offered throughout the year and meet on the same night once a week for five weeks. Medicaid covers the cost of the classes for the mother-to-be and her partner. For participants without Medicaid, the cost of the class series is $45.00 for the mother-to-be and her partner. Class size is limited and registration is required. For more information or to register call [phone]. Baby items donated by [employer] and the [employer] are given away during each class series.  SERVICES FOR WOMEN AND MEN Sexually Transmitted Infection appointments, including HIV testing, are available daily (weekdays, except holidays). Call early as same-day appointments are limited. For an appointment in either [facility] or [place], call [phone]. Services are confidential and free of charge.  Skin Testing for Tuberculosis Please call [phone]. Adult Immunizations are available, usually for a fee. Please call [phone] for details.  PLEASE CALL [phone] FOR AN APPOINTMENT FOR THE ABOVE SERVICES IN EITHER  [place] OR [place].  ° °[facility] provides up to the minute  recommended vaccines for your travel destination. We also provide essential health and political information to help insure a safe and pleasurable travel experience. This program is self-sustaining, however, fees are very competitive. We are a CERTIFIED YELLOW FEVER IMMUNIZATION approved clinic site. °PLEASE CALL [phone] FOR AN APPOINTMENT IN EITHER [place] OR [place].  ° °If you have questions about the services listed above, we want to answer them! Email us at: [email or web address] °Home Visiting Services for elderly and the disabled are available to residents of [place] who are in need of care that compares to the care offered by a nursing home, have needs that can be met by the program, and have [place] Medicaid. Other short term services are available to residents 18 years and older who are unable to meet requirements for eligibility to receive services from a certified home health agency, spend the majority of time at home, and need care for six months or less. ° °PLEASE CALL [phone] OR [phone] FOR MORE INFORMATION. °Medication Assistance Program serves as a link between pharmaceutical companies and patients to provide low cost or free prescription medications. This servce is available for residents who meet certain income restrictions and have no insurance coverage. ° °PLEASE CALL 641-8030 () OR [phone] ([place]) FOR MORE INFORMATION.  °Updated Feb. 21, 2013 ° ° °

## 2015-02-13 NOTE — ED Notes (Signed)
Children at bedside, instruction in espanol, questions concerns denied r/t dc pt ambulatory and a&ox4

## 2015-03-03 ENCOUNTER — Encounter: Payer: Self-pay | Admitting: Cardiovascular Disease

## 2015-03-03 ENCOUNTER — Ambulatory Visit (INDEPENDENT_AMBULATORY_CARE_PROVIDER_SITE_OTHER): Payer: Self-pay | Admitting: Cardiovascular Disease

## 2015-03-03 VITALS — BP 110/72 | HR 66 | Ht 61.0 in | Wt 144.6 lb

## 2015-03-03 DIAGNOSIS — R002 Palpitations: Secondary | ICD-10-CM | POA: Insufficient documentation

## 2015-03-03 NOTE — Assessment & Plan Note (Signed)
Miranda Navarro is a 44 year old married [place] female mother of 3 children is accompanied by her daughter-in-law Miranda Navarro today. She was referred by [facility] emergency room for palpitations. She has no cardiac risk factors. She drinks one cup of coffee a day. She was seen in the ER for palpitations. EKG showed normal sinus rhythm. This was 2 weeks ago. She's had no recurrent symptoms. I suggested that she discontinued caffeine intake. She will see mid-level provider back in 6 months. She has no symptoms between now and then she'll be seen back when necessary. Otherwise, she'll need an event monitor to further evaluate her palpitations

## 2015-03-03 NOTE — Patient Instructions (Signed)
Dr Gwenlyn Found recommends that you follow-up with an extender in 6 months and the as needed thereafter. You will receive a reminder letter in the mail two months in advance. If you don't receive a letter, please call our office to schedule the follow-up appointment.

## 2015-03-03 NOTE — Progress Notes (Signed)
     03/03/2015 Miranda Navarro   Sep 23, 1970  [id number]  Primary Physician No PCP Per Patient Primary Cardiologist: Lorretta Harp MD Renae Gloss   HPI:  Miranda Navarro is a 44 year old mildly overweight married Latino female mother of 3 children accompany by her daughter-in-law Miranda Navarro today. She was referred by [facility] emergency room for cardiovascular evaluation because of palpitations. 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 She drinks one cup of coffee a day. She had palpitations and was seen in the ER at which time the EKG shows sinus rhythm. She's had no further symptoms since discharge from the emergency room.   No current outpatient prescriptions on file.   No current facility-administered medications for this visit.    No Known Allergies  History   Social History  . Marital Status: Married    Spouse Name: N/A  . Number of Children: N/A  . Years of Education: N/A   Occupational History  . Not on file.   Social History Main Topics  . Smoking status: Never Smoker   . Smokeless tobacco: Not on file  . Alcohol Use: No  . Drug Use: No  . Sexual Activity: Not on file   Other Topics Concern  . Not on file   Social History Narrative     Review of Systems: General: negative for chills, fever, night sweats or weight changes.  Cardiovascular: negative for chest pain, dyspnea on exertion, edema, orthopnea, palpitations, paroxysmal nocturnal dyspnea or shortness of breath Dermatological: negative for rash Respiratory: negative for cough or wheezing Urologic: negative for hematuria Abdominal: negative for nausea, vomiting, diarrhea, bright red blood per rectum, melena, or hematemesis Neurologic: negative for visual changes, syncope, or dizziness All other systems reviewed and are otherwise negative except as noted above.    Blood pressure 110/72, pulse 66, height  5\' 1"  (1.549 m), weight 144 lb 9.6 oz (65.59 kg), last menstrual period 02/01/2015.  General appearance: alert and no distress Neck: no adenopathy, no carotid bruit, no JVD, supple, symmetrical, trachea midline and thyroid not enlarged, symmetric, no tenderness/mass/nodules Lungs: clear to auscultation bilaterally Heart: normal apical impulse Extremities: extremities normal, atraumatic, no cyanosis or edema  EKG not performed today  ASSESSMENT AND PLAN:   Palpitations MirandaNavarro is a 44 year old married [place] female mother of 3 children is accompanied by her daughter-in-law Miranda Navarro today. She was referred by [facility] emergency room for palpitations. She has no cardiac risk factors. She drinks one cup of coffee a day. She was seen in the ER for palpitations. EKG showed normal sinus rhythm. This was 2 weeks ago. She's had no recurrent symptoms. I suggested that she discontinued caffeine intake. She will see mid-level provider back in 6 months. She has no symptoms between now and then she'll be seen back when necessary. Otherwise, she'll need Navarro event monitor to further evaluate her palpitations      Lorretta Harp MD [facility], [facility] 03/03/2015 10:44 AM

## 2016-04-05 IMAGING — CR DG CHEST 2V
2 series · 2 of 2 positions shown · non-contrast
Comparison: None.

CLINICAL DATA: Patient with nausea, dizziness and chest
palpitations for 15 days.

EXAM:
CHEST  2 VIEW

[w chest pa]
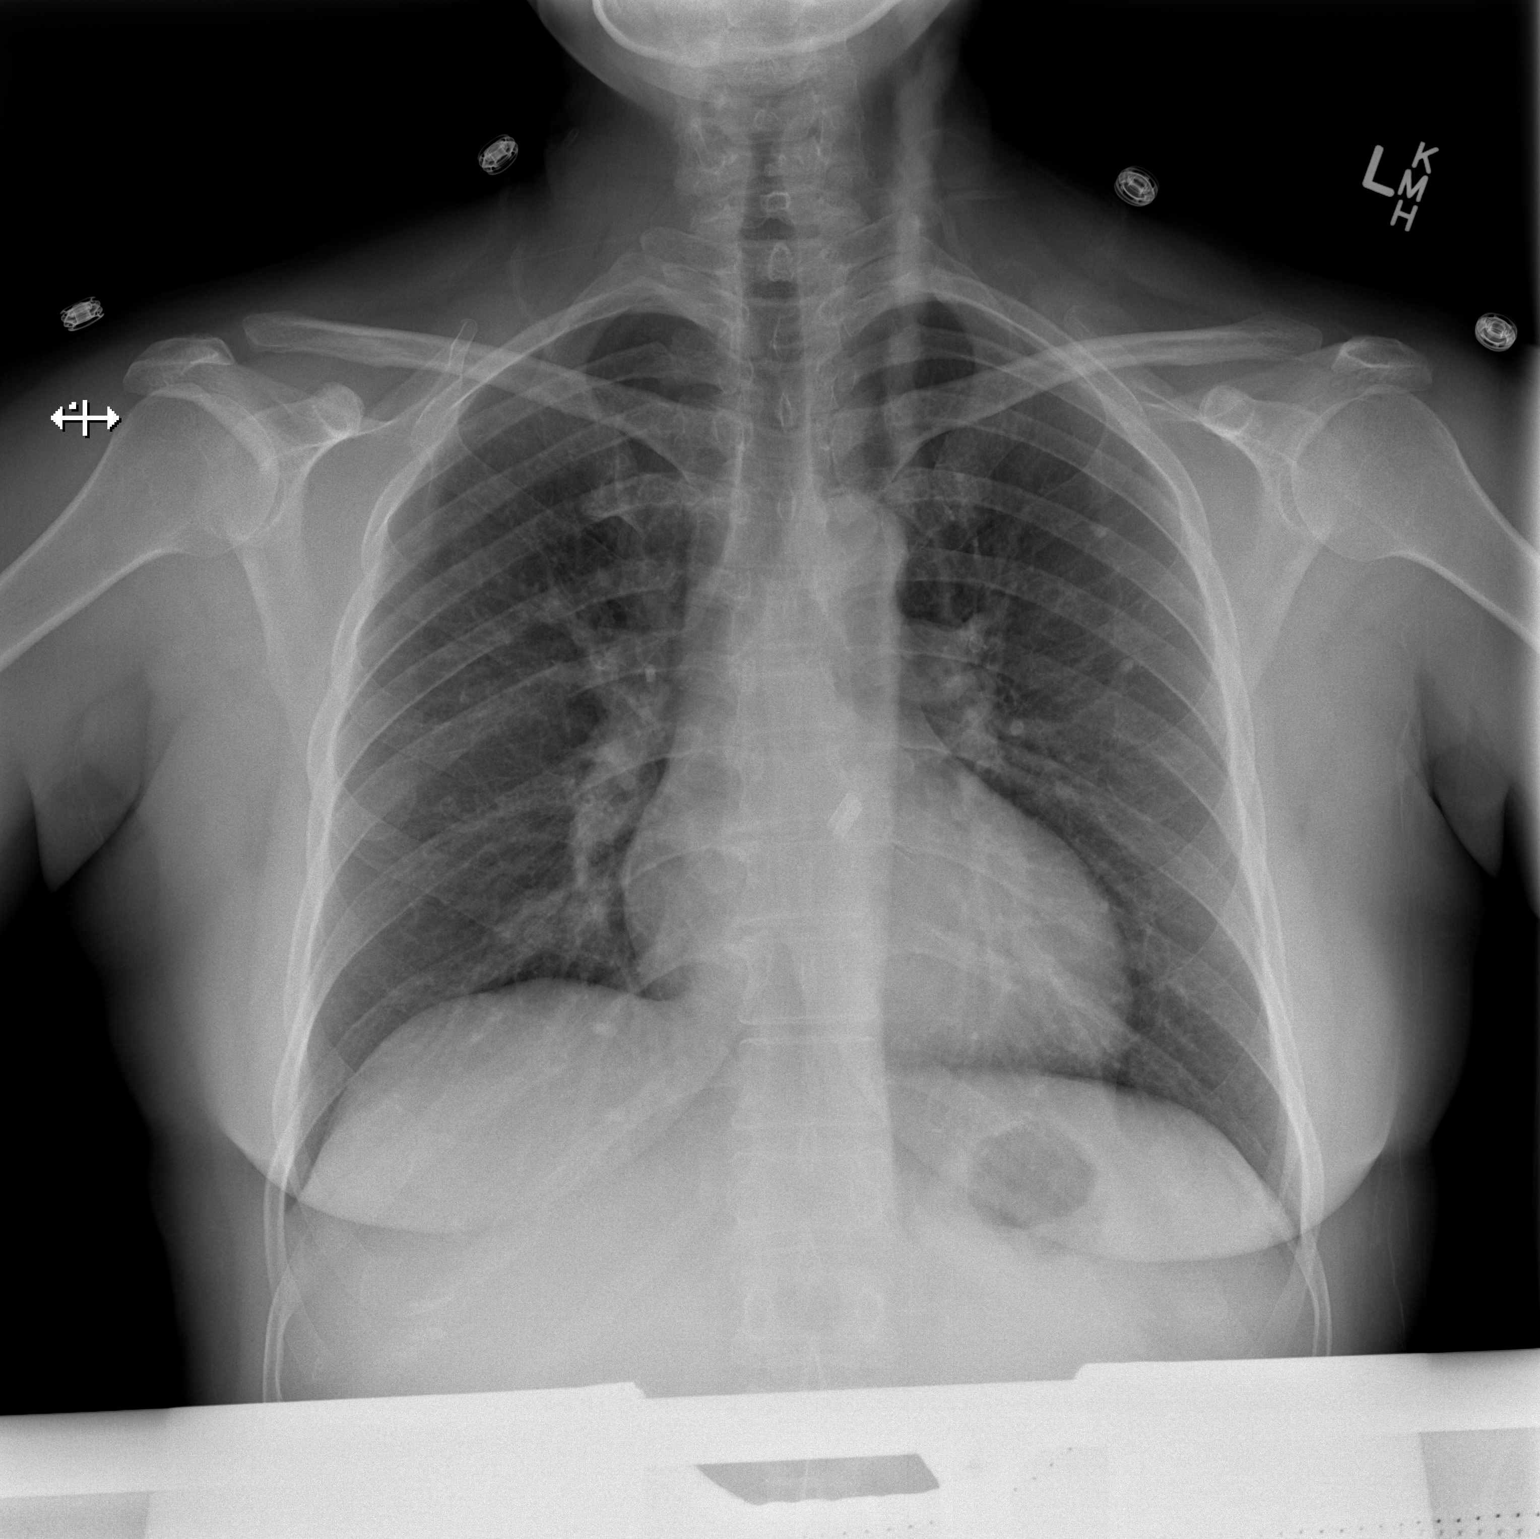

[w chest lat]
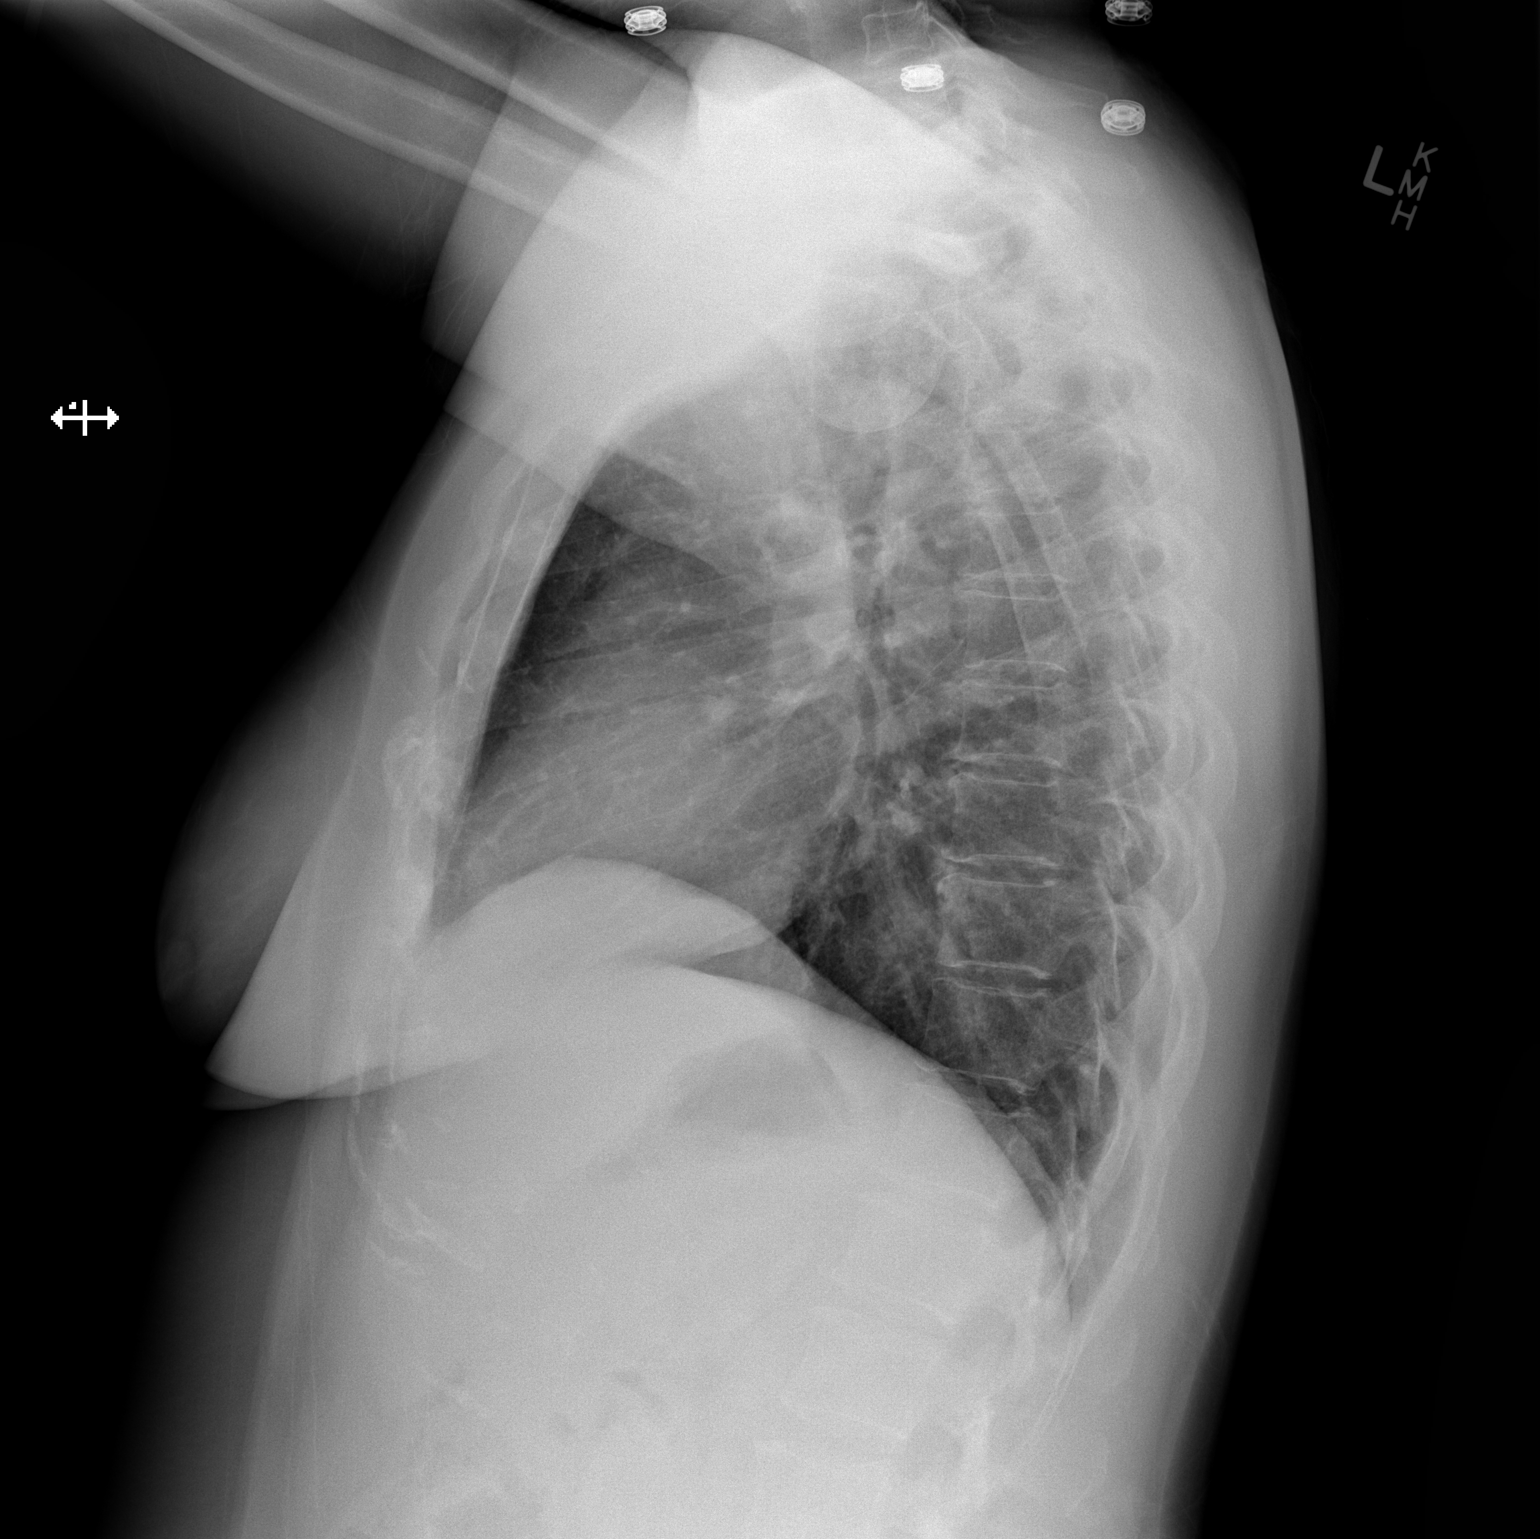

[2 of 2 positions shown; findings below may reference images not displayed]

FINDINGS: Normal cardiac and mediastinal contours. No consolidative pulmonary
opacities. No pleural effusion or pneumothorax. Regional skeleton is
unremarkable. Projecting over the mediastinum on frontal view is a
nonspecific radiodensity, potentially external to the patient.
IMPRESSION: No acute cardiopulmonary process.

Radiodensity projecting over the mediastinum on the frontal view,
potentially external to the patient.

## 2017-09-13 HISTORY — PX: OTHER SURGICAL HISTORY: SHX169

## 2017-10-20 ENCOUNTER — Other Ambulatory Visit: Payer: Self-pay

## 2017-10-23 LAB — CYTOLOGY - PAP: DIAGNOSIS: NEGATIVE

## 2019-04-09 ENCOUNTER — Emergency Department (HOSPITAL_COMMUNITY): Payer: Self-pay

## 2019-04-09 ENCOUNTER — Emergency Department (HOSPITAL_COMMUNITY)
Admission: EM | Admit: 2019-04-09 | Discharge: 2019-04-09 | Disposition: A | Discharge status: Home / Self Care | Payer: Self-pay | Attending: Emergency Medicine | Admitting: Emergency Medicine

## 2019-04-09 ENCOUNTER — Encounter (HOSPITAL_COMMUNITY): Payer: Self-pay | Admitting: *Deleted

## 2019-04-09 ENCOUNTER — Other Ambulatory Visit: Payer: Self-pay

## 2019-04-09 DIAGNOSIS — R1011 Right upper quadrant pain: Secondary | ICD-10-CM | POA: Insufficient documentation

## 2019-04-09 DIAGNOSIS — R1013 Epigastric pain: Secondary | ICD-10-CM | POA: Insufficient documentation

## 2019-04-09 DIAGNOSIS — R110 Nausea: Secondary | ICD-10-CM | POA: Insufficient documentation

## 2019-04-09 LAB — CBC
HCT: 39.4 % (ref 36.0–46.0)
Hemoglobin: 13.1 g/dL (ref 12.0–15.0)
MCH: 27.8 pg (ref 26.0–34.0)
MCHC: 33.2 g/dL (ref 30.0–36.0)
MCV: 83.7 fL (ref 80.0–100.0)
Platelets: 410 10*3/uL — ABNORMAL HIGH (ref 150–400)
RBC: 4.71 MIL/uL (ref 3.87–5.11)
RDW: 13.1 % (ref 11.5–15.5)
WBC: 9 10*3/uL (ref 4.0–10.5)
nRBC: 0 % (ref 0.0–0.2)

## 2019-04-09 LAB — COMPREHENSIVE METABOLIC PANEL
ALT: 23 U/L (ref 0–44)
AST: 19 U/L (ref 15–41)
Albumin: 3.9 g/dL (ref 3.5–5.0)
Alkaline Phosphatase: 59 U/L (ref 38–126)
Anion gap: 10 (ref 5–15)
BUN: 12 mg/dL (ref 6–20)
CO2: 22 mmol/L (ref 22–32)
Calcium: 9.3 mg/dL (ref 8.9–10.3)
Chloride: 105 mmol/L (ref 98–111)
Creatinine, Ser: 0.58 mg/dL (ref 0.44–1.00)
GFR calc Af Amer: >60 mL/min (ref >60)
GFR calc non Af Amer: >60 mL/min (ref >60)
Glucose, Bld: 95 mg/dL (ref 70–99)
Potassium: 3.7 mmol/L (ref 3.5–5.1)
Sodium: 137 mmol/L (ref 135–145)
Total Bilirubin: 0.5 mg/dL (ref 0.3–1.2)
Total Protein: 7.4 g/dL (ref 6.5–8.1)

## 2019-04-09 LAB — URINALYSIS, ROUTINE W REFLEX MICROSCOPIC
Bilirubin Urine: NEGATIVE
Glucose, UA: NEGATIVE mg/dL
Hgb urine dipstick: NEGATIVE
Ketones, ur: NEGATIVE mg/dL
Leukocytes,Ua: NEGATIVE
Nitrite: NEGATIVE
Protein, ur: NEGATIVE mg/dL
Specific Gravity, Urine: 1.014 (ref 1.005–1.030)
pH: 5 (ref 5.0–8.0)

## 2019-04-09 LAB — I-STAT BETA HCG BLOOD, ED (MC, WL, AP ONLY): I-stat hCG, quantitative: <5 m[IU]/mL (ref <5)

## 2019-04-09 LAB — LIPASE, BLOOD: Lipase: 26 U/L (ref 11–51)

## 2019-04-09 MED ORDER — FAMOTIDINE 20 MG PO TABS: 20.0000 mg | ORAL_TABLET | Freq: Two times a day (BID) | ORAL | 0 refills | Status: AC

## 2019-04-09 MED ORDER — SODIUM CHLORIDE 0.9% FLUSH: 3.0000 mL | Freq: Once | INTRAVENOUS | Status: DC

## 2019-04-09 NOTE — ED Triage Notes (Signed)
Pt has had RUQ/epigastric pain for the past three days. Tonight pain woke pt from sleep. Denies emesis, reports nausea

## 2019-04-09 NOTE — ED Notes (Signed)
Pt taken to US

## 2019-04-09 NOTE — Discharge Instructions (Addendum)
You were seen in the emergency department for 3 days of upper abdominal pain.  You had blood work and an ultrasound that did not show any signs of gallstones or any serious abnormalities.  This is probably gastritis and is usually improved with acid medication.  Please stop your indomethacin as this may be aggravating your condition.  Coffee may also be irritating your stomach.  Please follow-up with your doctor and return if any worsening symptoms.

## 2019-04-09 NOTE — ED Provider Notes (Signed)
[facility] EMERGENCY DEPARTMENT Provider Note   CSN: [id number] Arrival date & time: 04/09/19  0357     History   Chief Complaint Chief Complaint  Patient presents with  . Abdominal Pain    HPI Miranda Navarro is a 48 y.o. female.  She is complaining of 3 days of burning epigastric pain radiating through to her back that is worse with meals.  Tonight it woke her up from sleep so she presented to the ED.  She has had nausea but no vomiting.  No fevers or chills no chest pain no shortness of breath no diarrhea no urinary symptoms.  She was recently at her PCPs office for feeling shaky and having some pins-and-needles sensation in her feet and was placed on indomethacin and cyclobenzaprine. her symptoms started after this.  No prior surgical history.     The history is provided by the patient. The history is limited by a language barrier. A language interpreter was used (family at her request).  Abdominal Pain Pain location:  Epigastric Pain quality: burning   Pain radiates to:  Back Pain severity:  Moderate Onset quality:  Gradual Timing:  Intermittent Progression:  Unchanged Chronicity:  New Context: eating   Context: not alcohol use, not sick contacts and not trauma   Relieved by:  Nothing Worsened by:  Eating Ineffective treatments:  None tried Associated symptoms: nausea   Associated symptoms: no anorexia, no chest pain, no chills, no cough, no diarrhea, no dysuria, no fever, no hematemesis, no hematochezia, no hematuria, no shortness of breath, no sore throat, no vaginal bleeding and no vomiting   Risk factors: NSAID use   Risk factors: no alcohol abuse     Past Medical History:  Diagnosis Date  . Palpitations     Patient Active Problem List   Diagnosis Date Noted  . Palpitations 03/03/2015    History reviewed. No pertinent surgical history.   OB History   No obstetric history on file.      Home Medications    Prior to  Admission medications   Not on File    Family History Family History  Problem Relation Age of Onset  . Cancer Mother     Social History Social History   Tobacco Use  . Smoking status: Never Smoker  Substance Use Topics  . Alcohol use: No  . Drug use: No     Allergies   Patient has no known allergies.   Review of Systems Review of Systems  Constitutional: Negative for chills and fever.  HENT: Negative for sore throat.   Eyes: Negative for visual disturbance.  Respiratory: Negative for cough and shortness of breath.   Cardiovascular: Negative for chest pain.  Gastrointestinal: Positive for abdominal pain and nausea. Negative for anorexia, diarrhea, hematemesis, hematochezia and vomiting.  Genitourinary: Negative for dysuria, hematuria and vaginal bleeding.  Musculoskeletal: Positive for back pain.  Skin: Negative for rash.  Neurological: Negative for headaches.     Physical Exam Updated Vital Signs BP (!) 154/89 (BP Location: Right Arm)   Pulse 82   Temp 98.3 F (36.8 C) (Oral)   Resp 16   SpO2 99%   Physical Exam Vitals signs and nursing note reviewed.  Constitutional:      General: She is not in acute distress.    Appearance: She is well-developed.  HENT:     Head: Normocephalic and atraumatic.  Eyes:     Conjunctiva/sclera: Conjunctivae normal.  Neck:     Musculoskeletal: Neck  supple.  Cardiovascular:     Rate and Rhythm: Normal rate and regular rhythm.     Heart sounds: No murmur.  Pulmonary:     Effort: Pulmonary effort is normal. No respiratory distress.     Breath sounds: Normal breath sounds.  Abdominal:     Palpations: Abdomen is soft.     Tenderness: There is abdominal tenderness in the epigastric area. There is no guarding or rebound. Negative signs include Murphy's sign.  Musculoskeletal: Normal range of motion.     Right lower leg: No edema.     Left lower leg: No edema.  Skin:    General: Skin is warm and dry.     Capillary Refill:  Capillary refill takes less than 2 seconds.  Neurological:     General: No focal deficit present.     Mental Status: She is alert.     Sensory: No sensory deficit.     Motor: No weakness.     Gait: Gait normal.      ED Treatments / Results  Labs (all labs ordered are listed, but only abnormal results are displayed) Labs Reviewed  CBC - Abnormal; Notable for the following components:      Result Value   Platelets 410 (*)    All other components within normal limits  LIPASE, BLOOD  COMPREHENSIVE METABOLIC PANEL  URINALYSIS, ROUTINE W REFLEX MICROSCOPIC  I-STAT BETA HCG BLOOD, ED ([facility], [facility], [facility] ONLY)    EKG None  Radiology US Abdomen Limited Ruq  Result Date: 04/09/2019 CLINICAL DATA:  Right upper quadrant pain and nausea for 2 days. EXAM: ULTRASOUND ABDOMEN LIMITED RIGHT UPPER QUADRANT COMPARISON:  None. FINDINGS: Gallbladder: No gallstones or wall thickening visualized. No sonographic Murphy sign noted by sonographer. Common bile duct: Diameter: 2 mm, within normal limits Liver: No focal lesion identified. Within normal limits in parenchymal echogenicity. Portal vein is patent on color Doppler imaging with normal direction of blood flow towards the liver. Other: None. IMPRESSION: Normal study.  No hepatobiliary abnormality identified. Electronically Signed   By: Marlaine Hind M.D.   On: 04/09/2019 08:31    Procedures Procedures (including critical care time)  Medications Ordered in ED Medications - No data to display   Initial Impression / Assessment and Plan / ED Course  I have reviewed the triage vital signs and the nursing notes.  Pertinent labs & imaging results that were available during my care of the patient were reviewed by me and considered in my medical decision making (see chart for details).    Well appearing female with upper abd burning pain. Soft without masses or guarding. Labs and RUQ US unremarkable.   discharged with pepcid and instructions for followup  and return.    Final Clinical Impressions(s) / ED Diagnoses   Final diagnoses:  RUQ pain  Epigastric abdominal pain    ED Discharge Orders         Ordered    famotidine (PEPCID) 20 MG tablet  2 times daily     04/09/19 0835           Hayden Rasmussen, MD 04/10/19 [phone]

## 2019-09-01 ENCOUNTER — Other Ambulatory Visit: Payer: Self-pay

## 2019-09-02 ENCOUNTER — Ambulatory Visit: Payer: Self-pay | Admitting: Obstetrics & Gynecology

## 2019-09-21 ENCOUNTER — Ambulatory Visit (INDEPENDENT_AMBULATORY_CARE_PROVIDER_SITE_OTHER): Payer: Self-pay | Admitting: Obstetrics & Gynecology

## 2019-09-21 ENCOUNTER — Encounter: Payer: Self-pay | Admitting: Obstetrics & Gynecology

## 2019-09-21 ENCOUNTER — Other Ambulatory Visit: Payer: Self-pay

## 2019-09-21 VITALS — Ht 60.0 in | Wt 142.0 lb

## 2019-09-21 DIAGNOSIS — R21 Rash and other nonspecific skin eruption: Secondary | ICD-10-CM

## 2019-09-21 DIAGNOSIS — Z01419 Encounter for gynecological examination (general) (routine) without abnormal findings: Secondary | ICD-10-CM

## 2019-09-21 DIAGNOSIS — M2550 Pain in unspecified joint: Secondary | ICD-10-CM

## 2019-09-21 NOTE — Progress Notes (Signed)
    Shakeita Gomez-Delgadillo 07/24/1971 [id number]   History:    49 y.o. G3P3L3 Married  RP:  New patient presenting for annual gyn exam and many non-specific Sxs  HPI: Menses regular normal every month.  No BTB.  No pelvic pain.  Pap 06/2019 Negative, will obtain.  Many non-specific Sxs x a few months, especially at night including warm hands with rash, joint pains, H/As.    Past medical history,surgical history, family history and social history were all reviewed and documented in the EPIC chart.  Gynecologic History Patient's last menstrual period was 09/16/2019.  Obstetric History OB History  Gravida Para Term Preterm AB Living  3 3 3     3   SAB TAB Ectopic Multiple Live Births               # Outcome Date GA Lbr Len/2nd Weight Sex Delivery Anes PTL Lv  3 Term           2 Term           1 Term              ROS: A ROS was performed and pertinent positives and negatives are included in the history.  GENERAL: No fevers or chills. HEENT: No change in vision, no earache, sore throat or sinus congestion. NECK: No pain or stiffness. CARDIOVASCULAR: No chest pain or pressure. No palpitations. PULMONARY: No shortness of breath, cough or wheeze. GASTROINTESTINAL: No abdominal pain, nausea, vomiting or diarrhea, melena or bright red blood per rectum. GENITOURINARY: No urinary frequency, urgency, hesitancy or dysuria. MUSCULOSKELETAL: No joint or muscle pain, no back pain, no recent trauma. DERMATOLOGIC: No rash, no itching, no lesions. ENDOCRINE: No polyuria, polydipsia, no heat or cold intolerance. No recent change in weight. HEMATOLOGICAL: No anemia or easy bruising or bleeding. NEUROLOGIC: No headache, seizures, numbness, tingling or weakness. PSYCHIATRIC: No depression, no loss of interest in normal activity or change in sleep pattern.     Exam:   Ht 5' (1.524 m)   Wt 142 lb (64.4 kg)   LMP 09/16/2019   BMI 27.73 kg/m   Body mass index is 27.73 kg/m.  General appearance : Well  developed well nourished female. No acute distress  Extremities: no edema or skin discoloration or tenderness  Pelvic: Vulva: Normal             Vagina: No gross lesions or discharge  Cervix: No gross lesions or discharge  Uterus  AV, normal size, shape and consistency, non-tender and mobile  Adnexa  Without masses or tenderness  Anus: Normal  Recent lab panel wnl including Rheumatoid Factor Negative and ANA Negative.  TSH normal.  CBC normal.  CMP normal.   Assessment/Plan:  49 y.o. female for annual exam   1. Well female exam with routine gynecological exam Normal gynecologic exam.  Recent Pap test negative November 2020.  Recommend mammogram yearly.  Body mass index 27.73.  Recommend aerobic activities 5 times a week and light weightlifting every 2 days.  Health labs done August 2020.  2. Arthralgia, unspecified joint Complex non-specific symptoms worse at night.  Rheumatoid Factor Negative.  Possible early manifestations of Systemic Lupus Erythematosus.   - LUPUS(12) PANEL  3. Rash in adult As above. - LUPUS(12) PANEL  Other orders - lisinopril (ZESTRIL) 10 MG tablet; Take 10 mg by mouth daily.  Princess Bruins MD, 12:03 PM 09/21/2019

## 2019-09-23 LAB — LUPUS(12) PANEL
Anti Nuclear Antibody (ANA): POSITIVE — AB
C3 Complement: 146 mg/dL (ref 83–193)
C4 Complement: 17 mg/dL (ref 15–57)
ENA SM Ab Ser-aCnc: <1 AI
Rheumatoid fact SerPl-aCnc: <14 IU/mL (ref <14)
Ribosomal P Protein Ab: <1 AI
SM/RNP: <1 AI
SSA (Ro) (ENA) Antibody, IgG: <1 AI
SSB (La) (ENA) Antibody, IgG: <1 AI
Scleroderma (Scl-70) (ENA) Antibody, IgG: <1 AI
Thyroperoxidase Ab SerPl-aCnc: 2 IU/mL (ref <9)
ds DNA Ab: 1 IU/mL

## 2019-09-23 LAB — ANTI-NUCLEAR AB-TITER (ANA TITER)
ANA TITER: 1:40 {titer} — ABNORMAL HIGH
ANA Titer 1: 1:80 {titer} — ABNORMAL HIGH

## 2019-09-24 ENCOUNTER — Encounter: Payer: Self-pay | Admitting: Obstetrics & Gynecology

## 2019-09-24 NOTE — Patient Instructions (Signed)
1. Well female exam with routine gynecological exam Normal gynecologic exam.  Recent Pap test negative November 2020.  Recommend mammogram yearly.  Body mass index 27.73.  Recommend aerobic activities 5 times a week and light weightlifting every 2 days.  Health labs done August 2020.  2. Arthralgia, unspecified joint Complex non-specific symptoms worse at night.  Rheumatoid Factor Negative.  Possible early manifestations of Systemic Lupus Erythematosus.   - LUPUS(12) PANEL  3. Rash in adult As above. - LUPUS(12) PANEL  Other orders - lisinopril (ZESTRIL) 10 MG tablet; Take 10 mg by mouth daily.  Edrie, fue un placer conocerle hoy!  Voy a informarle de sus [employer].

## 2019-10-01 ENCOUNTER — Telehealth: Payer: Self-pay | Admitting: *Deleted

## 2019-10-01 DIAGNOSIS — R768 Other specified abnormal immunological findings in serum: Secondary | ICD-10-CM

## 2019-10-01 NOTE — Telephone Encounter (Signed)
-----   Message from Ramond Craver, [place] sent at 09/30/2019  1:05 PM EST ----- Regarding: refer to rheumatologist ANA positive.  Refer to Rheumatologist.   Spanish speaking patient. Patient knows. Prefers early morning appt. Not on a Monday preferable. Thanks

## 2019-10-01 NOTE — Telephone Encounter (Signed)
Referral placed in epic/Proficent at [place] office at [facility] will call to schedule.

## 2019-10-04 NOTE — Telephone Encounter (Signed)
Scheduled 12/09/19 @ 9:45am with Dr.Devershwar

## 2019-12-03 NOTE — Progress Notes (Signed)
Office Visit Note  Patient: Miranda Navarro             Date of Birth: 1971-06-04           MRN: [id number]             PCP: Patient, No Pcp Per Referring: Princess Bruins, MD Visit Date: 12/09/2019 Occupation: @GUAROCC @  Interpreter: Miquel Dunn  Subjective:  Pain in multiple joints and positive ANA.   History of Present Illness: Miranda Navarro is a 49 y.o. female seen in consultation per request of her PCP for evaluation of positive ANA.  Patient states that she started having left knee joint discomfort about 20 years ago when she was doing [employer].  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  She continues to have some discomfort in her left knee joint off and on.  She states she works at a [place] where she does a lot of lifting and packing.  She has been experiencing pain and discomfort in her bilateral elbows, bilateral hands and sometimes in her feet.  She has not noticed any joint swelling.  She states her muscles in her forearms hurt at times.  She states when she takes Advil it helps her.  But it causes GI discomfort so she stops the medication.  She has been given Pepcid in the past which she stopped.  She was seen by her PCP in January and labs showed positive ANA for that reason she was referred to me.  She is gravida 3 para 3 miscarriages 0.  There is no family history of autoimmune disease.  There is no history of oral ulcers, nasal ulcers, malar rash, photosensitivity, sicca symptoms, Raynaud's phenomenon or lymphadenopathy.  Activities of Daily Living:  Patient reports morning stiffness for 24 hours.   Patient Denies nocturnal pain.  Difficulty dressing/grooming: Denies Difficulty climbing stairs: Reports Difficulty getting out of chair: Reports Difficulty using hands for taps, buttons, cutlery, and/or writing: Denies  Review of Systems  Constitutional: Negative for fatigue, night sweats, weight gain and weight loss.  HENT: Positive for ear  ringing. Negative for mouth sores, trouble swallowing, trouble swallowing, mouth dryness and nose dryness.   Eyes: Negative for pain, redness, visual disturbance and dryness.  Respiratory: Negative for cough, shortness of breath and difficulty breathing.   Cardiovascular: Positive for swelling in legs/feet. Negative for chest pain, palpitations, hypertension and irregular heartbeat.  Gastrointestinal: Negative for blood in stool, constipation and diarrhea.  Endocrine: Positive for cold intolerance and increased urination.  Genitourinary: Negative for difficulty urinating and vaginal dryness.  Musculoskeletal: Positive for arthralgias, joint pain, morning stiffness and muscle tenderness. Negative for joint swelling, myalgias, muscle weakness and myalgias.  Skin: Positive for hair loss. Negative for color change, rash, skin tightness, ulcers and sensitivity to sunlight.  Allergic/Immunologic: Negative for susceptible to infections.  Neurological: Negative for dizziness, numbness, memory loss, night sweats and weakness.  Hematological: Negative for bruising/bleeding tendency and swollen glands.  Psychiatric/Behavioral: Positive for depressed mood and sleep disturbance. The patient is not nervous/anxious.     PMFS History:  Patient Active Problem List   Diagnosis Date Noted  . History of depression 12/09/2019  . History of gastroesophageal reflux (GERD) 12/09/2019  . Palpitations 03/03/2015    Past Medical History:  Diagnosis Date  . Cancer ([place])   . Palpitations     Family History  Problem Relation Age of Onset  . Cancer Mother   . Alzheimer's disease Father    Past Surgical History:  Procedure Laterality Date  . BASAL CELL CARCINOMA EXCISION    . OTHER SURGICAL HISTORY  09/13/2017    surgery on right side of the face  . TUBAL LIGATION     Social History   Social History Narrative  . Not on file    There is no immunization history on file for this patient.    Objective: Vital Signs: BP 128/65 (BP Location: Right Arm, Patient Position: Sitting, Cuff Size: Normal)   Pulse 65   Resp 14   Ht 5' 0.5" (1.537 m)   Wt 147 lb (66.7 kg)   BMI 28.24 kg/m    Physical Exam Vitals and nursing note reviewed.  Constitutional:      Appearance: She is well-developed.  HENT:     Head: Normocephalic and atraumatic.  Eyes:     Conjunctiva/sclera: Conjunctivae normal.  Cardiovascular:     Rate and Rhythm: Normal rate and regular rhythm.     Heart sounds: Normal heart sounds.  Pulmonary:     Effort: Pulmonary effort is normal.     Breath sounds: Normal breath sounds.  Abdominal:     General: Bowel sounds are normal.     Palpations: Abdomen is soft.  Musculoskeletal:     Cervical back: Normal range of motion.  Lymphadenopathy:     Cervical: No cervical adenopathy.  Skin:    General: Skin is warm and dry.     Capillary Refill: Capillary refill takes less than 2 seconds.  Neurological:     Mental Status: She is alert and oriented to person, place, and time.  Psychiatric:        Behavior: Behavior normal.      Musculoskeletal Exam: C-spine thoracic and lumbar spine were in good range of motion.  Shoulder joints, elbow joints, wrist joints, MCPs with good range of motion with no synovitis.  She had tenderness over bilateral lateral epicondyle area consistent with lateral epicondylitis.  She has bilateral PIP and DIP thickening consistent with osteoarthritis.  No synovitis was noted.  She had good range of motion of bilateral knee joints, bilateral hip joints, ankles and MTPs.  She has tenderness across her MTPs.  She has some discomfort range of motion of her left knee joint.  CDAI Exam: CDAI Score: -- Patient Global: --; Provider Global: -- Swollen: --; Tender: -- Joint Exam 12/09/2019   No joint exam has been documented for this visit   There is currently no information documented on the homunculus. Go to the Rheumatology activity and complete  the homunculus joint exam.  Investigation: No additional findings.  Imaging: No results found.  Recent Labs: Lab Results  Component Value Date   WBC 9.0 04/09/2019   HGB 13.1 04/09/2019   PLT 410 (H) 04/09/2019   NA 137 04/09/2019   K 3.7 04/09/2019   CL 105 04/09/2019   CO2 22 04/09/2019   GLUCOSE 95 04/09/2019   BUN 12 04/09/2019   CREATININE 0.58 04/09/2019   BILITOT 0.5 04/09/2019   ALKPHOS 59 04/09/2019   AST 19 04/09/2019   ALT 23 04/09/2019   PROT 7.4 04/09/2019   ALBUMIN 3.9 04/09/2019   CALCIUM 9.3 04/09/2019   GFRAA >60 04/09/2019    Speciality Comments: No specialty comments available.  Procedures:  No procedures performed Allergies: Patient has no known allergies.   Assessment / Plan:     Visit Diagnoses: Positive ANA (antinuclear antibody) - 09/21/19: ANA 1:80 NS, 1:40 cytoplasmic, RF<14, ENA-, complements WNL, thyroperoxidase ab-.  ANA is low  titer and not significant.  Complete ENA panel and complements are normal.  She has no clinical features of autoimmune disease.  Pain in both hands -clinical and radiographic findings are consistent with osteoarthritis.  Detailed counseling osteoarthritis was provided.  Joint protection muscle strengthening was discussed.  A handout on exercises was given.  A handout on natural anti-inflammatories were given.  Plan: XR Hand 2 View Right, XR Hand 2 View Left  Lateral epicondylitis of both elbows-she has tenderness over bilateral lateral epicondyle area.  A handout on exercises was given.  Chronic pain of left knee -she has pain and discomfort in her left knee joint.  No warmth swelling or effusion was noted.  Plan: XR KNEE 3 VIEW LEFT.  The x-ray showed moderate osteoarthritis and moderate chondromalacia patella.  Handout on knee exercises was given.  Weight loss diet and exercise was discussed.  Pain in both feet -she complains of discomfort in her feet and tenderness.  No synovitis was noted.  Plan: XR Foot 2 Views  Right, XR Foot 2 Views Left.  The x-ray of bilateral feet showed osteoarthritic changes.  Proper fitting shoes were discussed.  Other medical problems are listed as follows:  Language barrier-we had a interpreter in the room.  Palpitations  History of depression  History of gastroesophageal reflux (GERD)  Orders: Orders Placed This Encounter  Procedures  . XR KNEE 3 VIEW LEFT  . XR Hand 2 View Right  . XR Hand 2 View Left  . XR Foot 2 Views Right  . XR Foot 2 Views Left   No orders of the defined types were placed in this encounter.   Face-to-face time spent with patient was 60 minutes. Greater than 50% of time was spent in counseling and coordination of care.  Follow-Up Instructions: Return for Polyarthralgia, osteoarthritis, positive ANA.   Bo Merino, MD  Note - This record has been created using Editor, commissioning.  Chart creation errors have been sought, but may not always  have been located. Such creation errors do not reflect on  the standard of medical care.

## 2019-12-09 ENCOUNTER — Encounter: Payer: Self-pay | Admitting: Rheumatology

## 2019-12-09 ENCOUNTER — Ambulatory Visit: Payer: Self-pay

## 2019-12-09 ENCOUNTER — Other Ambulatory Visit: Payer: Self-pay

## 2019-12-09 ENCOUNTER — Ambulatory Visit: Payer: Self-pay | Admitting: Rheumatology

## 2019-12-09 VITALS — BP 128/65 | HR 65 | Resp 14 | Ht 60.5 in | Wt 147.0 lb

## 2019-12-09 DIAGNOSIS — G8929 Other chronic pain: Secondary | ICD-10-CM

## 2019-12-09 DIAGNOSIS — Z8659 Personal history of other mental and behavioral disorders: Secondary | ICD-10-CM

## 2019-12-09 DIAGNOSIS — M79642 Pain in left hand: Secondary | ICD-10-CM

## 2019-12-09 DIAGNOSIS — M7711 Lateral epicondylitis, right elbow: Secondary | ICD-10-CM

## 2019-12-09 DIAGNOSIS — Z789 Other specified health status: Secondary | ICD-10-CM

## 2019-12-09 DIAGNOSIS — R768 Other specified abnormal immunological findings in serum: Secondary | ICD-10-CM

## 2019-12-09 DIAGNOSIS — M25562 Pain in left knee: Secondary | ICD-10-CM

## 2019-12-09 DIAGNOSIS — M7712 Lateral epicondylitis, left elbow: Secondary | ICD-10-CM

## 2019-12-09 DIAGNOSIS — M79641 Pain in right hand: Secondary | ICD-10-CM

## 2019-12-09 DIAGNOSIS — M79671 Pain in right foot: Secondary | ICD-10-CM

## 2019-12-09 DIAGNOSIS — R002 Palpitations: Secondary | ICD-10-CM

## 2019-12-09 DIAGNOSIS — M79672 Pain in left foot: Secondary | ICD-10-CM

## 2019-12-09 DIAGNOSIS — Z8719 Personal history of other diseases of the digestive system: Secondary | ICD-10-CM | POA: Insufficient documentation

## 2019-12-09 NOTE — Patient Instructions (Signed)
Elbow and Forearm Exercises Ask your health care provider which exercises are safe for you. Do exercises exactly as told by your health care provider and adjust them as directed. It is normal to feel mild stretching, pulling, tightness, or discomfort as you do these exercises. Stop right away if you feel sudden pain or your pain gets worse. Do not begin these exercises until told by your health care provider. Range-of-motion exercises These exercises warm up your muscles and joints and improve the movement and flexibility of your injured elbow and forearm. The exercises also help to relieve pain, numbness, and tingling. These exercises are done using the muscles in your injured elbow and forearm (active). Elbow flexion, active 1. Hold your left / right arm at your side, and bend your elbow (flexion) as far as you can using only your arm muscles. 2. Hold this position for __________ seconds. 3. Slowly return to the starting position. Repeat __________ times. Complete this exercise __________ times a day. Elbow extension, active 1. Hold your left / right arm at your side, and straighten your elbow (extension) as much as you can using only your arm muscles. 2. Hold this position for __________ seconds. 3. Slowly return to the starting position. Repeat __________ times. Complete this exercise __________ times a day. Active forearm rotation, supination This is an exercise in which you turn (rotate) your forearm palm up (supination). 1. Stand or sit with your elbows at your sides. 2. Bend your left / right elbow to a 90-degree angle (right angle). 3. Rotate your palm up until you feel a gentle stretch on the inside of your forearm. 4. Hold this position for __________ seconds. 5. Slowly return to the starting position. Repeat __________ times. Complete this exercise __________ times a day. Active forearm rotation, pronation This is an exercise in which you turn (rotate) your forearm palm down  (pronation). 1. Stand or sit with your elbows at your sides. 2. Bend your left / right elbow to a 90-degree angle (right angle). 3. Rotate your palm down until you feel a gentle stretch on the top of your forearm. 4. Hold this position for __________ seconds. 5. Slowly return to the starting position. Repeat __________ times. Complete this exercise __________ times a day. Stretching exercises These exercises warm up your muscles and joints and improve the movement and flexibility of your injured elbow and forearm. These exercises also help to relieve pain, numbness, and tingling. These exercises are done using your healthy elbow and forearm to help stretch the muscles in your injured elbow and forearm (active-assisted). Elbow flexion, active-assisted  1. Hold your left / right arm at your side, and bend your elbow (flexion) as much as you can using your left / right arm muscles. 2. Use your other hand to bend your left / right elbow farther. To do this, gently push up on your forearm until you feel a gentle stretch on the back of your elbow. 3. Hold this position for __________ seconds. 4. Slowly return to the starting position. Repeat __________ times. Complete this exercise __________ times a day. Elbow extension, active-assisted  1. Hold your left / right arm at your side, and straighten your elbow (extension) as much as you can using your left / right arm muscles. 2. Use your other hand to straighten the left / right elbow farther. To do this, gently push down on your forearm until you feel a gentle stretch on the inside of your elbow. 3. Hold this position for __________  seconds. 4. Slowly return to the starting position. Repeat __________ times. Complete this exercise __________ times a day. Active-assisted forearm rotation, supination This is an exercise in which you turn (rotate) your forearm palm up (supination). 1. Sit with your left / right elbow bent in a 90-degree angle (right  angle) with your forearm resting on a table. 2. Keeping your upper body and shoulder still, rotate your forearm so your palm faces upward. 3. Use your other hand to help rotate your forearm further until you feel a gentle to moderate stretch. 4. Hold this position for __________ seconds. 5. Slowly release the stretch and return to the starting position. Repeat __________ times. Complete this exercise __________ times a day. Active-assisted forearm rotation, pronation This is an exercise in which you turn (rotate) your forearm palm down (pronation). 1. Sit with your left / right elbow bent in a 90-degree angle (right angle) with your forearm resting on a table. 2. Keeping your upper body and shoulder still, rotate your forearm so your palm faces the tabletop. 3. Use your other hand to help rotate your forearm further until you feel a gentle to moderate stretch. 4. Hold this position for __________ seconds. 5. Slowly release the stretch and return to the starting position. Repeat __________ times. Complete this exercise __________ times a day. Passive elbow flexion, supine 1. Lie on your back (supine position). 2. Extend your left / right arm up in the air, bracing it with your other hand. 3. Let your left / right hand slowly lower toward your shoulder (passive flexion), while your elbow stays pointed toward the ceiling. You should feel a gentle stretch along the back of your upper arm and elbow. 4. If instructed by your health care provider, you may increase the intensity of your stretch by adding a small wrist weight or hand weight. 5. Hold this position for __________ seconds. 6. Slowly return to the starting position. Repeat __________ times. Complete this exercise __________ times a day. Passive elbow extension, supine  1. Lie on your back (supine position). Make sure that you are in a comfortable position that lets you relax your arm muscles. 2. Place a folded towel under your left /  right upper arm so your elbow and shoulder are at the same height. Straighten your left / right arm so your elbow does not rest on the bed or towel. 3. Let the weight of your hand stretch your elbow (passive extension). Keep your arm and chest muscles relaxed. You should feel a stretch on the inside of your elbow. 4. If told by your health care provider, you may increase the intensity of your stretch by adding a small wrist weight or hand weight. 5. Hold this position for __________ seconds. 6. Slowly release the stretch. Repeat __________ times. Complete this exercise __________ times a day. Strengthening exercises These exercises build strength and endurance in your elbow and forearm. Endurance is the ability to use your muscles for a long time, even after they get tired. Elbow flexion, isometric  1. Stand or sit up straight. 2. Bend your left / right elbow in a 90-degree angle (right angle), and keep your forearm at the height of your waist. Your thumb should be pointed toward the ceiling (neutral forearm). 3. Place your other hand on top of your left / right forearm. Gently push down while you resist with your left / right arm (isometric flexion). Push as hard as you can with both arms without causing any pain or movement   at your left / right elbow. 4. Hold this position for __________ seconds. 5. Slowly release the tension in both arms. Let your muscles relax completely before you repeat the exercise. Repeat __________ times. Complete this exercise __________ times a day. Elbow extension, isometric  1. Stand or sit up straight. 2. Place your left / right arm so your palm faces your abdomen and is at the height of your waist. 3. Place your other hand on the underside of your left / right forearm. Gently push up while you resist with your left / right arm (isometric extension). Push as hard as you can with both arms without causing any pain or movement at your left / right elbow. 4. Hold this  position for __________ seconds. 5. Slowly release the tension in both arms. Let your muscles relax completely before you repeat the exercise. Repeat __________ times. Complete this exercise __________ times a day. Elbow flexion with forearm palm up  1. Sit on a firm chair without armrests, or stand up. 2. Place your left / right arm at your side with your elbow straight and your palm facing forward. 3. Holding a __________weight or gripping a rubber exercise band or tubing, bend your elbow to bring your hand toward your shoulder (flexion). 4. Hold this position for __________ seconds. 5. Slowly return to the starting position. Repeat __________ times. Complete this exercise __________ times a day. Elbow extension, active  1. Sit on a firm chair without armrests, or stand up. 2. Hold a rubber exercise band or tubing in both hands. 3. Keeping your upper arms at your sides, bring both hands up to your left / right shoulder. Keep your left / right hand just below your other hand. 4. Straighten your left / right elbow (extension) while keeping your other arm still. 5. Hold this position for __________ seconds. 6. Control the resistance of the band or tubing as you return to the starting position. Repeat __________ times. Complete this exercise __________ times a day. Forearm rotation, supination  1. Sit with your left / right forearm supported on a table. Your elbow should be at waist height and bent at a 90-degree angle (right angle). 2. Gently grasp a lightweight hammer. 3. Rest your hand over the edge of the table with your palm facing down. 4. Without moving your left / right elbow, slowly rotate your forearm to turn your palm up toward the ceiling (supination). 5. Hold this position for __________ seconds. 6. Slowly return to the starting position. Repeat __________ times. Complete this exercise __________ times a day. Forearm rotation, pronation  1. Sit with your left / right forearm  supported on a table. Keep your elbow below shoulder height. 2. Gently grasp a lightweight hammer. 3. Rest your hand over the edge of the table with your palm facing up. 4. Without moving your left / right elbow, slowly rotate your forearm to turn your palm down toward the floor (pronation). 5. Hold this position for __________seconds. 6. Slowly return to the starting position. Repeat __________ times. Complete this exercise __________ times a day. This information is not intended to replace advice given to you by your health care provider. Make sure you discuss any questions you have with your health care provider. Document Revised: 12/03/2018 Document Reviewed: 09/02/2018 [employer] Patient Education  2020 [employer] Journal for Nurse Practitioners, 15(4), 263-267. Retrieved June 01, 2018 from http://clinicalkey.com/nursing">  Knee Exercises Ask your health care provider which exercises are safe for you. Do exercises exactly as   told by your health care provider and adjust them as directed. It is normal to feel mild stretching, pulling, tightness, or discomfort as you do these exercises. Stop right away if you feel sudden pain or your pain gets worse. Do not begin these exercises until told by your health care provider. Stretching and range-of-motion exercises These exercises warm up your muscles and joints and improve the movement and flexibility of your knee. These exercises also help to relieve pain and swelling. Knee extension, prone 4. Lie on your abdomen (prone position) on a bed. 5. Place your left / right knee just beyond the edge of the surface so your knee is not on the bed. You can put a towel under your left / right thigh just above your kneecap for comfort. 6. Relax your leg muscles and allow gravity to straighten your knee (extension). You should feel a stretch behind your left / right knee. 7. Hold this position for __________ seconds. 8. Scoot up so your knee is supported  between repetitions. Repeat __________ times. Complete this exercise __________ times a day. Knee flexion, active  4. Lie on your back with both legs straight. If this causes back discomfort, bend your left / right knee so your foot is flat on the floor. 5. Slowly slide your left / right heel back toward your buttocks. Stop when you feel a gentle stretch in the front of your knee or thigh (flexion). 6. Hold this position for __________ seconds. 7. Slowly slide your left / right heel back to the starting position. Repeat __________ times. Complete this exercise __________ times a day. Quadriceps stretch, prone  6. Lie on your abdomen on a firm surface, such as a bed or padded floor. 7. Bend your left / right knee and hold your ankle. If you cannot reach your ankle or pant leg, loop a belt around your foot and grab the belt instead. 8. Gently pull your heel toward your buttocks. Your knee should not slide out to the side. You should feel a stretch in the front of your thigh and knee (quadriceps). 9. Hold this position for __________ seconds. Repeat __________ times. Complete this exercise __________ times a day. Hamstring, supine 6. Lie on your back (supine position). 7. Loop a belt or towel over the ball of your left / right foot. The ball of your foot is on the walking surface, right under your toes. 8. Straighten your left / right knee and slowly pull on the belt to raise your leg until you feel a gentle stretch behind your knee (hamstring). ? Do not let your knee bend while you do this. ? Keep your other leg flat on the floor. 9. Hold this position for __________ seconds. Repeat __________ times. Complete this exercise __________ times a day. Strengthening exercises These exercises build strength and endurance in your knee. Endurance is the ability to use your muscles for a long time, even after they get tired. Quadriceps, isometric This exercise stretches the muscles in front of your  thigh (quadriceps) without moving your knee joint (isometric). 5. Lie on your back with your left / right leg extended and your other knee bent. Put a rolled towel or small pillow under your knee if told by your health care provider. 6. Slowly tense the muscles in the front of your left / right thigh. You should see your kneecap slide up toward your hip or see increased dimpling just above the knee. This motion will push the back of the knee   toward the floor. 7. For __________ seconds, hold the muscle as tight as you can without increasing your pain. 8. Relax the muscles slowly and completely. Repeat __________ times. Complete this exercise __________ times a day. Straight leg raises This exercise stretches the muscles in front of your thigh (quadriceps) and the muscles that move your hips (hip flexors). 5. Lie on your back with your left / right leg extended and your other knee bent. 6. Tense the muscles in the front of your left / right thigh. You should see your kneecap slide up or see increased dimpling just above the knee. Your thigh may even shake a bit. 7. Keep these muscles tight as you raise your leg 4-6 inches (10-15 cm) off the floor. Do not let your knee bend. 8. Hold this position for __________ seconds. 9. Keep these muscles tense as you lower your leg. 10. Relax your muscles slowly and completely after each repetition. Repeat __________ times. Complete this exercise __________ times a day. Hamstring, isometric 6. Lie on your back on a firm surface. 7. Bend your left / right knee about __________ degrees. 8. Dig your left / right heel into the surface as if you are trying to pull it toward your buttocks. Tighten the muscles in the back of your thighs (hamstring) to "dig" as hard as you can without increasing any pain. 9. Hold this position for __________ seconds. 10. Release the tension gradually and allow your muscles to relax completely for __________ seconds after each  repetition. Repeat __________ times. Complete this exercise __________ times a day. Hamstring curls If told by your health care provider, do this exercise while wearing ankle weights. Begin with __________ lb weights. Then increase the weight by 1 lb (0.5 kg) increments. Do not wear ankle weights that are more than __________ lb. 6. Lie on your abdomen with your legs straight. 7. Bend your left / right knee as far as you can without feeling pain. Keep your hips flat against the floor. 8. Hold this position for __________ seconds. 9. Slowly lower your leg to the starting position. Repeat __________ times. Complete this exercise __________ times a day. Squats This exercise strengthens the muscles in front of your thigh and knee (quadriceps). 7. Stand in front of a table, with your feet and knees pointing straight ahead. You may rest your hands on the table for balance but not for support. 8. Slowly bend your knees and lower your hips like you are going to sit in a chair. ? Keep your weight over your heels, not over your toes. ? Keep your lower legs upright so they are parallel with the table legs. ? Do not let your hips go lower than your knees. ? Do not bend lower than told by your health care provider. ? If your knee pain increases, do not bend as low. 9. Hold the squat position for __________ seconds. 10. Slowly push with your legs to return to standing. Do not use your hands to pull yourself to standing. Repeat __________ times. Complete this exercise __________ times a day. Wall slides This exercise strengthens the muscles in front of your thigh and knee (quadriceps). 7. Lean your back against a smooth wall or door, and walk your feet out 18-24 inches (46-61 cm) from it. 8. Place your feet hip-width apart. 9. Slowly slide down the wall or door until your knees bend __________ degrees. Keep your knees over your heels, not over your toes. Keep your knees in line with your   hips. 10. Hold  this position for __________ seconds. Repeat __________ times. Complete this exercise __________ times a day. Straight leg raises This exercise strengthens the muscles that rotate the leg at the hip and move it away from your body (hip abductors). 6. Lie on your side with your left / right leg in the top position. Lie so your head, shoulder, knee, and hip line up. You may bend your bottom knee to help you keep your balance. 7. Roll your hips slightly forward so your hips are stacked directly over each other and your left / right knee is facing forward. 8. Leading with your heel, lift your top leg 4-6 inches (10-15 cm). You should feel the muscles in your outer hip lifting. ? Do not let your foot drift forward. ? Do not let your knee roll toward the ceiling. 9. Hold this position for __________ seconds. 10. Slowly return your leg to the starting position. 11. Let your muscles relax completely after each repetition. Repeat __________ times. Complete this exercise __________ times a day. Straight leg raises This exercise stretches the muscles that move your hips away from the front of the pelvis (hip extensors). 6. Lie on your abdomen on a firm surface. You can put a pillow under your hips if that is more comfortable. 7. Tense the muscles in your buttocks and lift your left / right leg about 4-6 inches (10-15 cm). Keep your knee straight as you lift your leg. 8. Hold this position for __________ seconds. 9. Slowly lower your leg to the starting position. 10. Let your leg relax completely after each repetition. Repeat __________ times. Complete this exercise __________ times a day. This information is not intended to replace advice given to you by your health care provider. Make sure you discuss any questions you have with your health care provider. Document Revised: 06/02/2018 Document Reviewed: 06/02/2018 [employer] Patient Education  [place]. Hand Exercises Hand exercises can be  helpful for almost anyone. These exercises can strengthen the hands, improve flexibility and movement, and increase blood flow to the hands. These results can make work and daily tasks easier. Hand exercises can be especially helpful for people who have joint pain from arthritis or have nerve damage from overuse (carpal tunnel syndrome). These exercises can also help people who have injured a hand. Exercises Most of these hand exercises are gentle stretching and motion exercises. It is usually safe to do them often throughout the day. Warming up your hands before exercise may help to reduce stiffness. You can do this with gentle massage or by placing your hands in warm water for 10-15 minutes. It is normal to feel some stretching, pulling, tightness, or mild discomfort as you begin new exercises. This will gradually improve. Stop an exercise right away if you feel sudden, severe pain or your pain gets worse. Ask your health care provider which exercises are best for you. Knuckle bend or "claw" fist 1. Stand or sit with your arm, hand, and all five fingers pointed straight up. Make sure to keep your wrist straight during the exercise. 2. Gently bend your fingers down toward your palm until the tips of your fingers are touching the top of your palm. Keep your big knuckle straight and just bend the small knuckles in your fingers. 3. Hold this position for __________ seconds. 4. Straighten (extend) your fingers back to the starting position. Repeat this exercise 5-10 times with each hand. Full finger fist 1. Stand or sit with your arm,  hand, and all five fingers pointed straight up. Make sure to keep your wrist straight during the exercise. 2. Gently bend your fingers into your palm until the tips of your fingers are touching the middle of your palm. 3. Hold this position for __________ seconds. 4. Extend your fingers back to the starting position, stretching every joint fully. Repeat this exercise 5-10  times with each hand. Straight fist 1. Stand or sit with your arm, hand, and all five fingers pointed straight up. Make sure to keep your wrist straight during the exercise. 2. Gently bend your fingers at the big knuckle, where your fingers meet your hand, and the middle knuckle. Keep the knuckle at the tips of your fingers straight and try to touch the bottom of your palm. 3. Hold this position for __________ seconds. 4. Extend your fingers back to the starting position, stretching every joint fully. Repeat this exercise 5-10 times with each hand. Tabletop 1. Stand or sit with your arm, hand, and all five fingers pointed straight up. Make sure to keep your wrist straight during the exercise. 2. Gently bend your fingers at the big knuckle, where your fingers meet your hand, as far down as you can while keeping the small knuckles in your fingers straight. Think of forming a tabletop with your fingers. 3. Hold this position for __________ seconds. 4. Extend your fingers back to the starting position, stretching every joint fully. Repeat this exercise 5-10 times with each hand. Finger spread 1. Place your hand flat on a table with your palm facing down. Make sure your wrist stays straight as you do this exercise. 2. Spread your fingers and thumb apart from each other as far as you can until you feel a gentle stretch. Hold this position for __________ seconds. 3. Bring your fingers and thumb tight together again. Hold this position for __________ seconds. Repeat this exercise 5-10 times with each hand. Making circles 1. Stand or sit with your arm, hand, and all five fingers pointed straight up. Make sure to keep your wrist straight during the exercise. 2. Make a circle by touching the tip of your thumb to the tip of your index finger. 3. Hold for __________ seconds. Then open your hand wide. 4. Repeat this motion with your thumb and each finger on your hand. Repeat this exercise 5-10 times with  each hand. Thumb motion 1. Sit with your forearm resting on a table and your wrist straight. Your thumb should be facing up toward the ceiling. Keep your fingers relaxed as you move your thumb. 2. Lift your thumb up as high as you can toward the ceiling. Hold for __________ seconds. 3. Bend your thumb across your palm as far as you can, reaching the tip of your thumb for the small finger (pinkie) side of your palm. Hold for __________ seconds. Repeat this exercise 5-10 times with each hand. Grip strengthening  1. Hold a stress ball or other soft ball in the middle of your hand. 2. Slowly increase the pressure, squeezing the ball as much as you can without causing pain. Think of bringing the tips of your fingers into the middle of your palm. All of your finger joints should bend when doing this exercise. 3. Hold your squeeze for __________ seconds, then relax. Repeat this exercise 5-10 times with each hand. Contact a health care provider if:  Your hand pain or discomfort gets much worse when you do an exercise.  Your hand pain or discomfort does not improve  within 2 hours after you exercise. If you have any of these problems, stop doing these exercises right away. Do not do them again unless your health care provider says that you can. Get help right away if:  You develop sudden, severe hand pain or swelling. If this happens, stop doing these exercises right away. Do not do them again unless your health care provider says that you can. This information is not intended to replace advice given to you by your health care provider. Make sure you discuss any questions you have with your health care provider. Document Revised: 12/03/2018 Document Reviewed: 08/13/2018 [employer] Patient Education  [employer].

## 2020-05-30 IMAGING — US ULTRASOUND ABDOMEN LIMITED
1 series · 14 of 25 positions shown · non-contrast
Comparison: None.

CLINICAL DATA: Right upper quadrant pain and nausea for 2 days.

EXAM:
ULTRASOUND ABDOMEN LIMITED RIGHT UPPER QUADRANT

[Series 1: ultrasound abdomen limited · 14 of 50 slices shown]
[im 1/50]
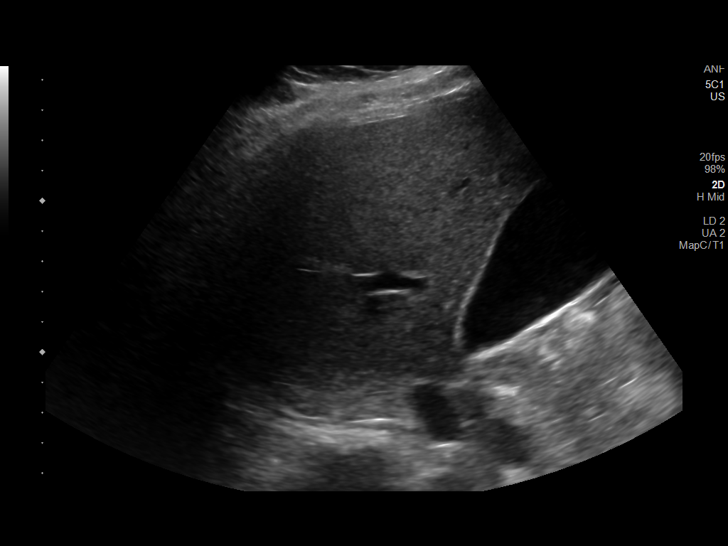
[im 5/50]
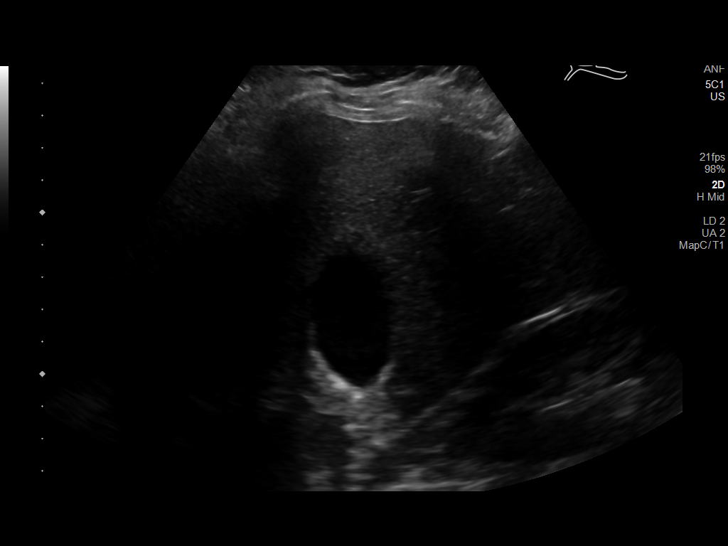
[im 9/50]
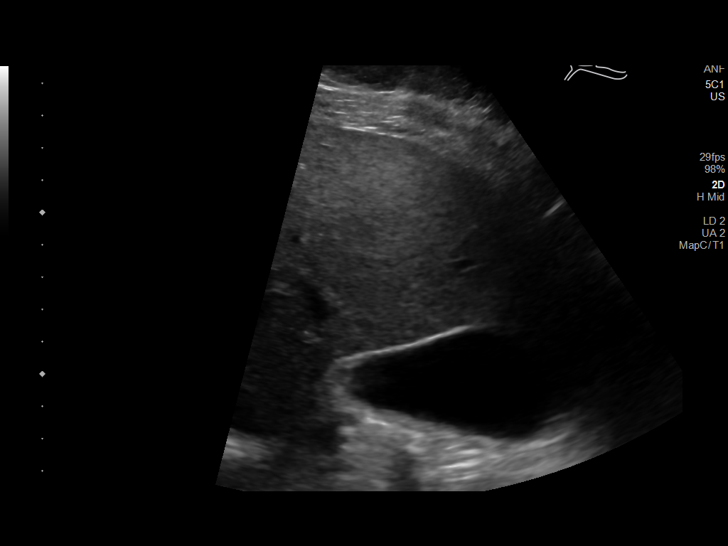
[im 13/50]
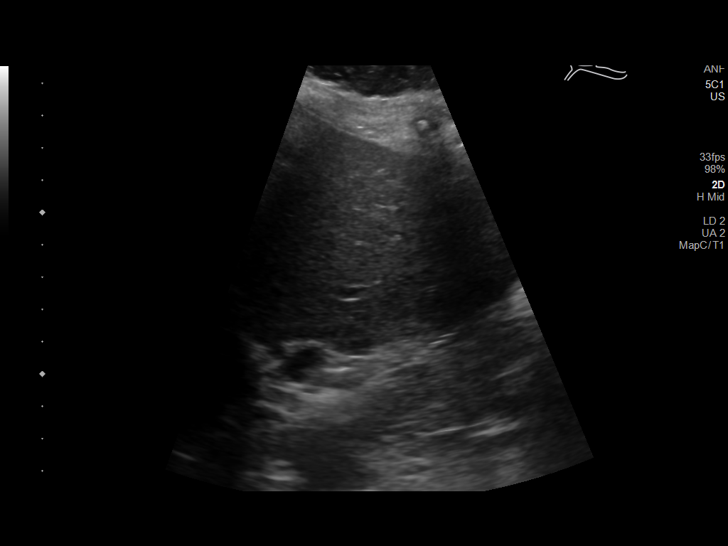
[im 17/50]
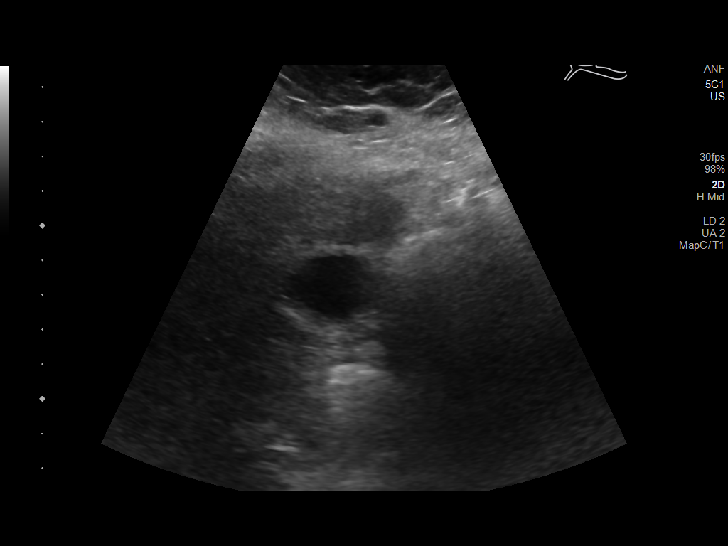
[im 19/50]
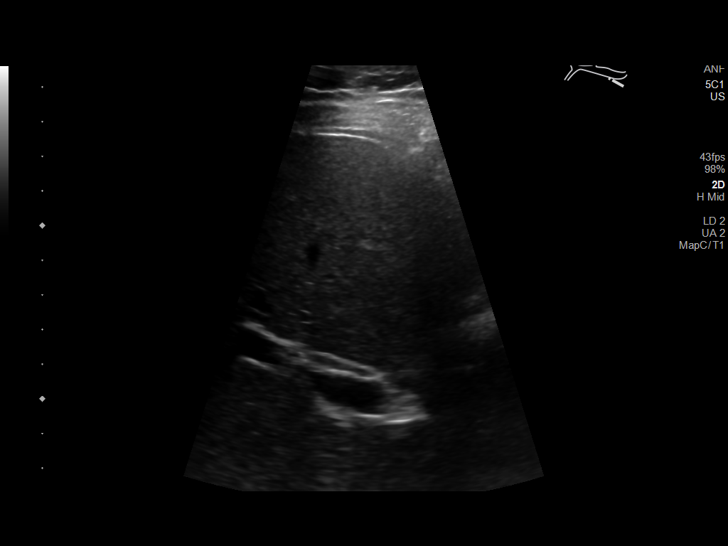
[im 23/50]
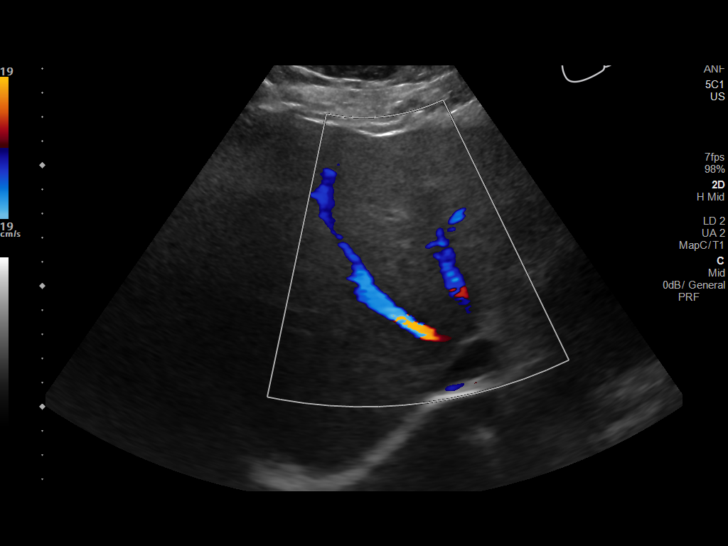
[im 27/50]
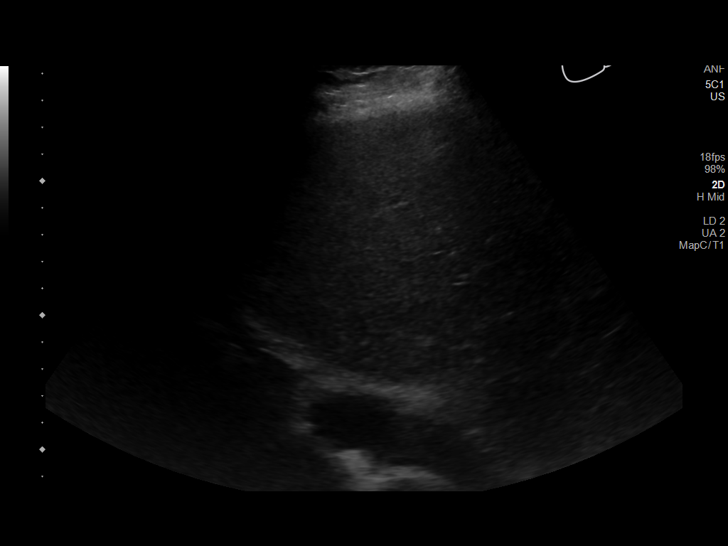
[im 31/50]
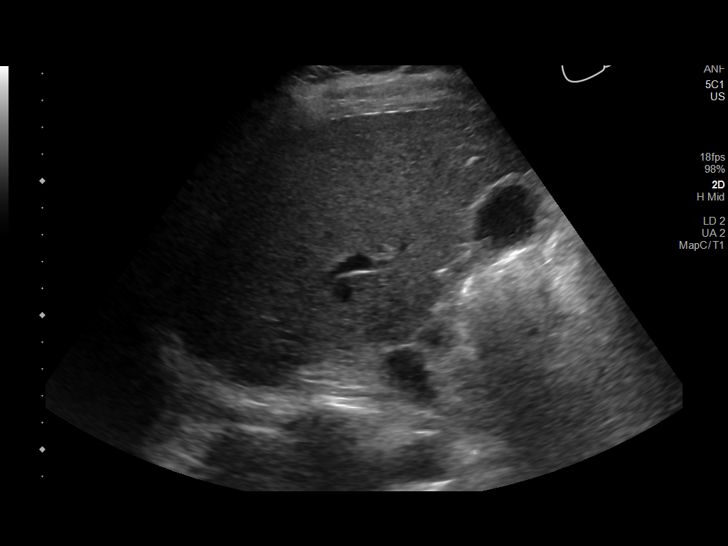
[im 33/50]
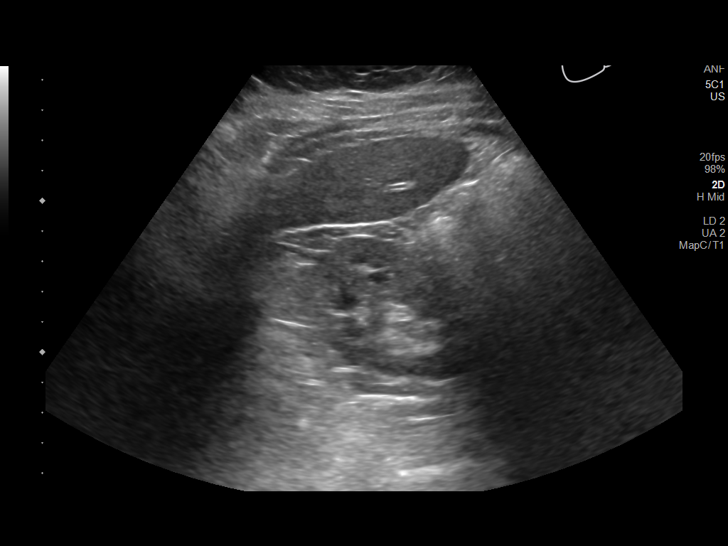
[im 37/50]
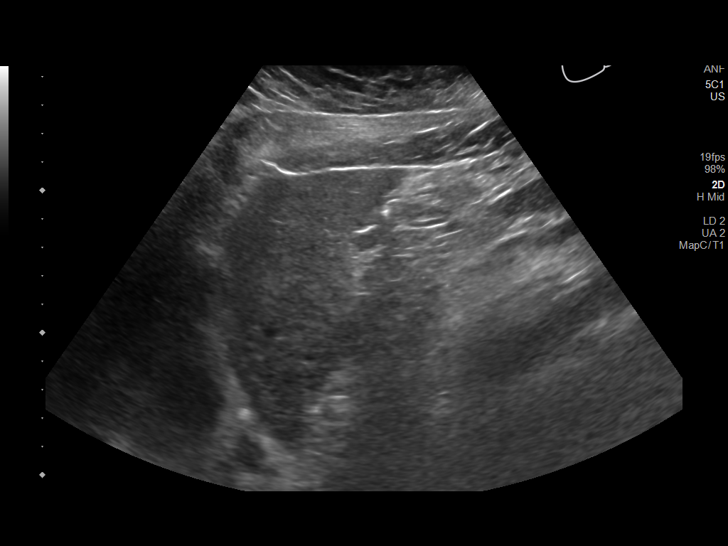
[im 41/50]
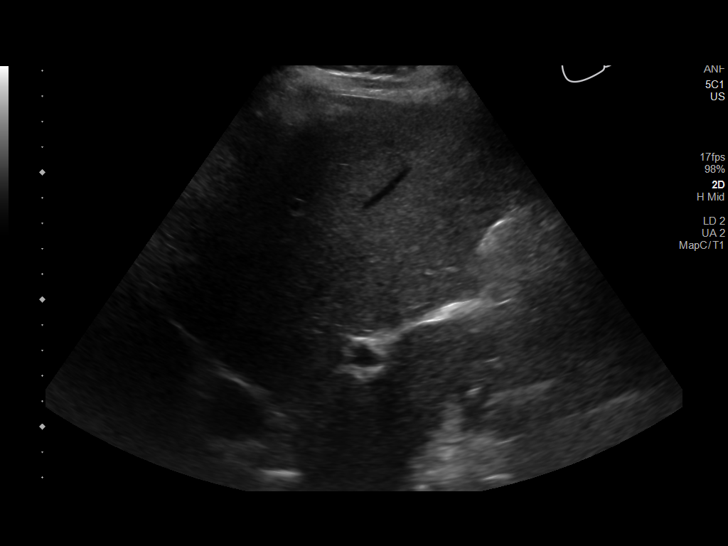
[im 45/50]
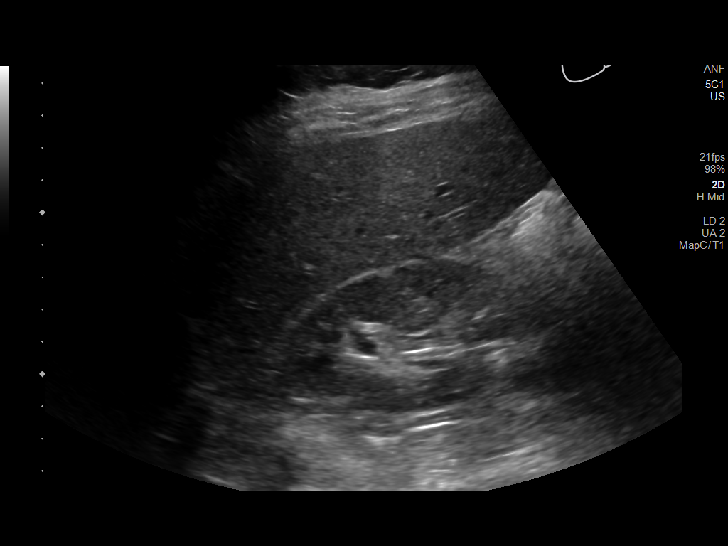
[im 50/50]
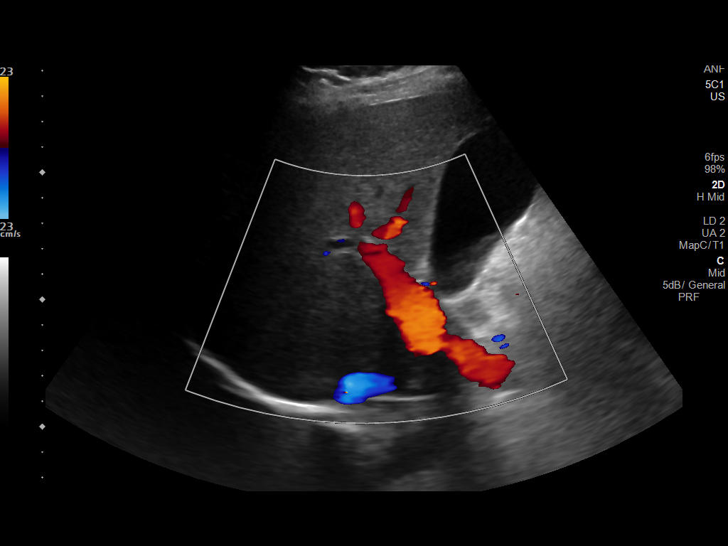

[14 of 25 positions shown; findings below may reference images not displayed]

FINDINGS: Gallbladder:

No gallstones or wall thickening visualized. No sonographic Murphy
sign noted by sonographer.

Common bile duct:

Diameter: 2 mm, within normal limits

Liver:

No focal lesion identified. Within normal limits in parenchymal
echogenicity. Portal vein is patent on color Doppler imaging with
normal direction of blood flow towards the liver.

Other: None.
IMPRESSION: Normal study.  No hepatobiliary abnormality identified.
